# Patient Record
Sex: Female | Born: 1950 | Race: Black or African American | Hispanic: No | Marital: Single | Smoking: Current every day smoker
Health system: Southern US, Community
[De-identification: ages and names within clinical notes are randomized; demographics above are authoritative.]

## PROBLEM LIST (undated history)

## (undated) DIAGNOSIS — K219 Gastro-esophageal reflux disease without esophagitis: Secondary | ICD-10-CM

## (undated) DIAGNOSIS — M8580 Other specified disorders of bone density and structure, unspecified site: Secondary | ICD-10-CM

## (undated) DIAGNOSIS — E785 Hyperlipidemia, unspecified: Secondary | ICD-10-CM

## (undated) DIAGNOSIS — I10 Essential (primary) hypertension: Secondary | ICD-10-CM

## (undated) HISTORY — DX: Gastro-esophageal reflux disease without esophagitis: K21.9

## (undated) HISTORY — PX: APPENDECTOMY: SHX54

## (undated) HISTORY — DX: Hyperlipidemia, unspecified: E78.5

## (undated) HISTORY — DX: Other specified disorders of bone density and structure, unspecified site: M85.80

---

## 1997-11-15 ENCOUNTER — Ambulatory Visit: Admission: RE | Admit: 1997-11-15 | Discharge: 1997-11-15 | Payer: Self-pay | Admitting: Sports Medicine

## 1997-12-02 ENCOUNTER — Encounter: Admission: RE | Admit: 1997-12-02 | Discharge: 1997-12-02 | Payer: Self-pay | Admitting: Sports Medicine

## 1998-01-07 ENCOUNTER — Encounter: Admission: RE | Admit: 1998-01-07 | Discharge: 1998-01-07 | Payer: Self-pay | Admitting: Family Medicine

## 1998-02-02 ENCOUNTER — Encounter: Admission: RE | Admit: 1998-02-02 | Discharge: 1998-02-02 | Payer: Self-pay | Admitting: Family Medicine

## 1998-12-30 ENCOUNTER — Encounter: Admission: RE | Admit: 1998-12-30 | Discharge: 1998-12-30 | Payer: Self-pay | Admitting: Family Medicine

## 1999-07-11 ENCOUNTER — Encounter: Admission: RE | Admit: 1999-07-11 | Discharge: 1999-07-11 | Payer: Self-pay | Admitting: Family Medicine

## 2000-04-18 ENCOUNTER — Emergency Department (HOSPITAL_COMMUNITY): Admission: EM | Admit: 2000-04-18 | Discharge: 2000-04-18 | Payer: Self-pay | Admitting: Emergency Medicine

## 2000-05-01 ENCOUNTER — Emergency Department (HOSPITAL_COMMUNITY): Admission: EM | Admit: 2000-05-01 | Discharge: 2000-05-01 | Payer: Self-pay | Admitting: Emergency Medicine

## 2000-05-02 ENCOUNTER — Emergency Department (HOSPITAL_COMMUNITY): Admission: EM | Admit: 2000-05-02 | Discharge: 2000-05-02 | Payer: Self-pay | Admitting: Emergency Medicine

## 2000-12-23 ENCOUNTER — Encounter (INDEPENDENT_AMBULATORY_CARE_PROVIDER_SITE_OTHER): Payer: Self-pay | Admitting: *Deleted

## 2000-12-23 LAB — CONVERTED CEMR LAB

## 2001-04-04 ENCOUNTER — Encounter: Admission: RE | Admit: 2001-04-04 | Discharge: 2001-04-04 | Payer: Self-pay

## 2001-04-24 ENCOUNTER — Encounter: Admission: RE | Admit: 2001-04-24 | Discharge: 2001-04-24 | Payer: Self-pay

## 2001-05-02 ENCOUNTER — Encounter: Payer: Self-pay | Admitting: Obstetrics

## 2001-05-02 ENCOUNTER — Ambulatory Visit (HOSPITAL_COMMUNITY): Admission: RE | Admit: 2001-05-02 | Discharge: 2001-05-02 | Payer: Self-pay | Admitting: Obstetrics

## 2001-08-19 ENCOUNTER — Encounter: Admission: RE | Admit: 2001-08-19 | Discharge: 2001-08-19 | Payer: Self-pay | Admitting: Family Medicine

## 2001-09-02 ENCOUNTER — Encounter: Admission: RE | Admit: 2001-09-02 | Discharge: 2001-09-02 | Payer: Self-pay | Admitting: *Deleted

## 2001-10-14 ENCOUNTER — Emergency Department (HOSPITAL_COMMUNITY): Admission: EM | Admit: 2001-10-14 | Discharge: 2001-10-14 | Payer: Self-pay | Admitting: Emergency Medicine

## 2002-01-29 ENCOUNTER — Emergency Department (HOSPITAL_COMMUNITY): Admission: EM | Admit: 2002-01-29 | Discharge: 2002-01-29 | Payer: Self-pay

## 2002-07-27 ENCOUNTER — Encounter: Admission: RE | Admit: 2002-07-27 | Discharge: 2002-07-27 | Payer: Self-pay | Admitting: Family Medicine

## 2002-07-30 ENCOUNTER — Encounter: Admission: RE | Admit: 2002-07-30 | Discharge: 2002-07-30 | Payer: Self-pay | Admitting: Family Medicine

## 2002-08-28 ENCOUNTER — Encounter: Admission: RE | Admit: 2002-08-28 | Discharge: 2002-08-28 | Payer: Self-pay | Admitting: Family Medicine

## 2005-09-21 ENCOUNTER — Encounter: Admission: RE | Admit: 2005-09-21 | Discharge: 2005-09-21 | Payer: Self-pay | Admitting: Internal Medicine

## 2005-11-06 ENCOUNTER — Inpatient Hospital Stay (HOSPITAL_COMMUNITY): Admission: EM | Admit: 2005-11-06 | Discharge: 2005-11-07 | Payer: Self-pay | Admitting: Emergency Medicine

## 2005-11-07 ENCOUNTER — Ambulatory Visit: Payer: Self-pay | Admitting: Cardiology

## 2006-08-22 DIAGNOSIS — E7800 Pure hypercholesterolemia, unspecified: Secondary | ICD-10-CM | POA: Insufficient documentation

## 2006-08-22 DIAGNOSIS — H539 Unspecified visual disturbance: Secondary | ICD-10-CM

## 2006-08-22 DIAGNOSIS — F17200 Nicotine dependence, unspecified, uncomplicated: Secondary | ICD-10-CM | POA: Insufficient documentation

## 2006-08-22 DIAGNOSIS — R011 Cardiac murmur, unspecified: Secondary | ICD-10-CM

## 2006-08-22 DIAGNOSIS — K1321 Leukoplakia of oral mucosa, including tongue: Secondary | ICD-10-CM

## 2006-08-22 DIAGNOSIS — E669 Obesity, unspecified: Secondary | ICD-10-CM

## 2006-08-22 DIAGNOSIS — I10 Essential (primary) hypertension: Secondary | ICD-10-CM | POA: Insufficient documentation

## 2006-08-22 DIAGNOSIS — G44209 Tension-type headache, unspecified, not intractable: Secondary | ICD-10-CM

## 2006-08-23 ENCOUNTER — Encounter (INDEPENDENT_AMBULATORY_CARE_PROVIDER_SITE_OTHER): Payer: Self-pay | Admitting: *Deleted

## 2007-02-28 ENCOUNTER — Emergency Department (HOSPITAL_COMMUNITY): Admission: EM | Admit: 2007-02-28 | Discharge: 2007-02-28 | Payer: Self-pay | Admitting: Family Medicine

## 2010-06-18 ENCOUNTER — Emergency Department (HOSPITAL_COMMUNITY)
Admission: EM | Admit: 2010-06-18 | Discharge: 2010-06-18 | Payer: Self-pay | Source: Home / Self Care | Admitting: Emergency Medicine

## 2010-06-18 ENCOUNTER — Encounter (INDEPENDENT_AMBULATORY_CARE_PROVIDER_SITE_OTHER): Payer: Self-pay | Admitting: Emergency Medicine

## 2010-10-08 ENCOUNTER — Emergency Department (HOSPITAL_COMMUNITY)
Admission: EM | Admit: 2010-10-08 | Discharge: 2010-10-08 | Disposition: A | Discharge status: Home / Self Care | Payer: No Typology Code available for payment source | Attending: Emergency Medicine | Admitting: Emergency Medicine

## 2010-10-08 ENCOUNTER — Emergency Department (HOSPITAL_COMMUNITY): Payer: No Typology Code available for payment source

## 2010-10-08 DIAGNOSIS — M545 Low back pain, unspecified: Secondary | ICD-10-CM | POA: Insufficient documentation

## 2010-10-08 DIAGNOSIS — I10 Essential (primary) hypertension: Secondary | ICD-10-CM | POA: Insufficient documentation

## 2010-10-08 DIAGNOSIS — M5137 Other intervertebral disc degeneration, lumbosacral region: Secondary | ICD-10-CM | POA: Insufficient documentation

## 2010-10-08 DIAGNOSIS — M51379 Other intervertebral disc degeneration, lumbosacral region without mention of lumbar back pain or lower extremity pain: Secondary | ICD-10-CM | POA: Insufficient documentation

## 2010-11-10 NOTE — Discharge Summary (Signed)
Rebecca Pope, Rebecca Pope               ACCOUNT NO.:  [id number]   MEDICAL RECORD NO.:  [id number]          PATIENT TYPE:  INP   LOCATION:  3707                         FACILITY:  [facility]   PHYSICIAN:  Fleet Contras, M.D.    DATE OF BIRTH:  1950-10-26   DATE OF ADMISSION:  11/06/2005  DATE OF DISCHARGE:  11/07/2005                                 DISCHARGE SUMMARY   ADMITTING PHYSICIAN:  Fleet Contras.   DISCHARGE PHYSICIAN:  Fleet Contras.   HISTORY OF PRESENT ILLNESS:  Rebecca Pope is a 60 year old African American  lady with past medical history significant for hypertension and obesity.  She went to the emergency room at [facility] with one day history  of headaches, one episode of vomiting.  She had no dizziness, slurring of  speech, weakness of extremities or seizures.  She had no chest pain,  shortness of breath, orthopnea or PND.  She had no palpitations.  In the  emergency room, her blood pressure was 190/130, heart rate of 68.  She was  given intravenous labetalol with inadequate response.  This was, therefore,  changed to IV Nipride.  Blood pressure remained elevated, she was,  therefore, admitted to the hospital for close monitoring.   HOSPITAL COURSE:  On admission, within 24 hours, her blood pressure was down  to 110/70, heart rate was 64 regular.  She had no further headache or  vomiting.   LABORATORY DATA:  Were all negative, including a CT scan of the head.  Her  urine drug screen was negative.  CKs and troponins x3 were all negative.   Patient, therefore, considered stable for discharge home on Nov 07, 2005.   ADMISSION DIAGNOSIS:  Hypertensive urgency.   She also had a 2D echocardiogram, renal artery Doppler performed to rule out  renal artery stenosis, the results of these were pending at the time of  discharge.  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  The  echocardiogram showing normal LV function with mild LVH.   CONDITION ON DISCHARGE:  Her condition on discharge was stable.   DISPOSITION:  Home.   DISCHARGE MEDICATIONS:  Her discharge medications were:  1. Diovan HCT 160/12.5 one daily.  2. Metoprolol 25 b.i.d.  3. Aspirin 325 mg daily.   She is to followup with me in 3 days.  She was to quit tobacco use.      Fleet Contras, M.D.  Electronically Signed     EA/MEDQ  D:  01/28/2006  T:  01/29/2006  Job:  [id number]

## 2011-04-06 LAB — I-STAT 8, (EC8 V) (CONVERTED LAB)
Acid-Base Excess: 1
Bicarbonate: 26.6 — ABNORMAL HIGH
Chloride: 106
HCT: 44
Operator id: 239701
TCO2: 28
pCO2, Ven: 43.7 — ABNORMAL LOW

## 2011-04-06 LAB — POCT I-STAT CREATININE: Operator id: 239701

## 2013-04-07 ENCOUNTER — Encounter (HOSPITAL_COMMUNITY): Payer: Self-pay | Admitting: Emergency Medicine

## 2013-04-07 ENCOUNTER — Emergency Department (INDEPENDENT_AMBULATORY_CARE_PROVIDER_SITE_OTHER)
Admission: EM | Admit: 2013-04-07 | Discharge: 2013-04-07 | Disposition: A | Discharge status: Home / Self Care | Payer: Self-pay | Source: Home / Self Care | Attending: Family Medicine | Admitting: Family Medicine

## 2013-04-07 DIAGNOSIS — E669 Obesity, unspecified: Secondary | ICD-10-CM

## 2013-04-07 DIAGNOSIS — I10 Essential (primary) hypertension: Secondary | ICD-10-CM | POA: Diagnosis present

## 2013-04-07 HISTORY — DX: Essential (primary) hypertension: I10

## 2013-04-07 LAB — POCT I-STAT, CHEM 8
BUN: 16 mg/dL (ref 6–23)
Calcium, Ion: 1.15 mmol/L (ref 1.13–1.30)
Creatinine, Ser: 1.2 mg/dL — ABNORMAL HIGH (ref 0.50–1.10)
Glucose, Bld: 88 mg/dL (ref 70–99)
Sodium: 141 mEq/L (ref 135–145)
TCO2: 29 mmol/L (ref 0–100)

## 2013-04-07 MED ORDER — HYDROCHLOROTHIAZIDE 25 MG PO TABS: 25.0000 mg | ORAL_TABLET | Freq: Every day | ORAL | Status: DC

## 2013-04-07 NOTE — ED Provider Notes (Signed)
Rebecca Pope is a 62 y.o. female who presents to Urgent Care today for hypertension. Patient has a past medical history for hypertension, however she has not been on hydrochlorothiazide for over 6 years. She typically has done well. Yesterday she noted headaches and checked her blood pressure at work and found it to be 191/124. She is feeling well today with no continued headache chest pain palpitations or trouble breathing. She denies any weakness or numbness and feels well otherwise. She does not have a doctor or insurance. She does smoke.   Past Medical History  Diagnosis Date  . Hypertension    History  Substance Use Topics  . Smoking status: Current Every Day Smoker -- 1.00 packs/day    Types: Cigarettes  . Smokeless tobacco: Not on file  . Alcohol Use: No   ROS as above Medications reviewed. No current facility-administered medications for this encounter.   Current Outpatient Prescriptions  Medication Sig Dispense Refill  . hydrochlorothiazide (HYDRODIURIL) 25 MG tablet Take 1 tablet (25 mg total) by mouth daily.  30 tablet  2    Exam:  BP 136/102  Pulse 73  Temp(Src) 99 F (37.2 C) (Oral)  Resp 17  SpO2 100% Gen: Well NAD HEENT: EOMI,  MMM Lungs: CTABL Nl WOB Heart: RRR no MRG Abd: NABS, NT, ND Exts: Non edematous BL  LE, warm and well perfused.  Neuro: Alert and oriented normal balance coordination and gait  I stat chem 8 results:  Sodium: 141 Potassium: 3.7 Chloride: 105 Bicarbonate: 29 BUN: 16 Creatinine 1.2 Glucose: 88 Hemoglobin: 16  Assessment and Plan: 62 y.o. female with uncontrolled hypertension. Plan to restart hydrochlorothiazide at 25 mg. Refer to the [employer]. Discussed warning signs or symptoms. Please see discharge instructions. Patient expresses understanding.      Rodolph Bong, MD 04/07/13 [phone]

## 2013-04-07 NOTE — ED Notes (Signed)
C/o hypertension. BP 191/124 yesterday. Having headaches. Denies chest pain, visual changes, and sob.   States she stopped bp meds many years ago because she does not like to take medication.

## 2013-10-23 ENCOUNTER — Encounter: Payer: Self-pay | Admitting: Internal Medicine

## 2013-10-23 ENCOUNTER — Ambulatory Visit: Payer: BC Managed Care – PPO | Attending: Internal Medicine | Admitting: Internal Medicine

## 2013-10-23 VITALS — BP 168/96 | HR 62 | Temp 98.9°F | Resp 16 | Ht 64.5 in | Wt 209.2 lb

## 2013-10-23 DIAGNOSIS — I10 Essential (primary) hypertension: Secondary | ICD-10-CM

## 2013-10-23 DIAGNOSIS — Z23 Encounter for immunization: Secondary | ICD-10-CM

## 2013-10-23 DIAGNOSIS — F17200 Nicotine dependence, unspecified, uncomplicated: Secondary | ICD-10-CM

## 2013-10-23 DIAGNOSIS — Z79899 Other long term (current) drug therapy: Secondary | ICD-10-CM | POA: Insufficient documentation

## 2013-10-23 DIAGNOSIS — R21 Rash and other nonspecific skin eruption: Secondary | ICD-10-CM | POA: Insufficient documentation

## 2013-10-23 DIAGNOSIS — Z0000 Encounter for general adult medical examination without abnormal findings: Secondary | ICD-10-CM

## 2013-10-23 MED ORDER — HYDROCHLOROTHIAZIDE 25 MG PO TABS: 25.0000 mg | ORAL_TABLET | Freq: Every day | ORAL | Status: DC

## 2013-10-23 NOTE — Patient Instructions (Signed)
DASH Diet The DASH diet stands for "Dietary Approaches to Stop Hypertension." It is a healthy eating plan that has been shown to reduce high blood pressure (hypertension) in as little as 14 days, while also possibly providing other significant health benefits. These other health benefits include reducing the risk of breast cancer after menopause and reducing the risk of type 2 diabetes, heart disease, colon cancer, and stroke. Health benefits also include weight loss and slowing kidney failure in patients with chronic kidney disease.  DIET GUIDELINES  Limit salt (sodium). Your diet should contain less than 1500 mg of sodium daily.  Limit refined or processed carbohydrates. Your diet should include mostly whole grains. Desserts and added sugars should be used sparingly.  Include small amounts of heart-healthy fats. These types of fats include nuts, oils, and tub margarine. Limit saturated and trans fats. These fats have been shown to be harmful in the body. CHOOSING FOODS  The following food groups are based on a 2000 calorie diet. See your Registered Dietitian for individual calorie needs. Grains and Grain Products (6 to 8 servings daily)  Eat More Often: Whole-wheat bread, brown rice, whole-grain or wheat pasta, quinoa, popcorn without added fat or salt (air popped).  Eat Less Often: White bread, white pasta, white rice, cornbread. Vegetables (4 to 5 servings daily)  Eat More Often: Fresh, frozen, and canned vegetables. Vegetables may be raw, steamed, roasted, or grilled with a minimal amount of fat.  Eat Less Often/Avoid: Creamed or fried vegetables. Vegetables in a cheese sauce. Fruit (4 to 5 servings daily)  Eat More Often: All fresh, canned (in natural juice), or frozen fruits. Dried fruits without added sugar. One hundred percent fruit juice ( cup [237 mL] daily).  Eat Less Often: Dried fruits with added sugar. Canned fruit in light or heavy syrup. Lean Meats, Fish, and Poultry (2  servings or less daily. One serving is 3 to 4 oz [85-114 g]).  Eat More Often: Ninety percent or leaner ground beef, tenderloin, sirloin. Round cuts of beef, chicken breast, turkey breast. All fish. Grill, bake, or broil your meat. Nothing should be fried.  Eat Less Often/Avoid: Fatty cuts of meat, turkey, or chicken leg, thigh, or wing. Fried cuts of meat or fish. Dairy (2 to 3 servings)  Eat More Often: Low-fat or fat-free milk, low-fat plain or light yogurt, reduced-fat or part-skim cheese.  Eat Less Often/Avoid: Milk (whole, 2%).Whole milk yogurt. Full-fat cheeses. Nuts, Seeds, and Legumes (4 to 5 servings per week)  Eat More Often: All without added salt.  Eat Less Often/Avoid: Salted nuts and seeds, canned beans with added salt. Fats and Sweets (limited)  Eat More Often: Vegetable oils, tub margarines without trans fats, sugar-free gelatin. Mayonnaise and salad dressings.  Eat Less Often/Avoid: Coconut oils, palm oils, butter, stick margarine, cream, half and half, cookies, candy, pie. FOR MORE INFORMATION The Dash Diet Eating Plan: www.dashdiet.org Document Released: 05/31/2011 Document Revised: 09/03/2011 Document Reviewed: 05/31/2011 ExitCare Patient Information 2014 [employer]. Smoking Cessation Quitting smoking is important to your health and has many advantages. However, it is not always easy to quit since nicotine is a very addictive drug. Often times, people try 3 times or more before being able to quit. This document explains the best ways for you to prepare to quit smoking. Quitting takes hard work and a lot of effort, but you can do it. ADVANTAGES OF QUITTING SMOKING  You will live longer, feel better, and live better.  Your body will feel the   impact of quitting smoking almost immediately.  Within 20 minutes, blood pressure decreases. Your pulse returns to its normal level.  After 8 hours, carbon monoxide levels in the blood return to normal. Your oxygen level  increases.  After 24 hours, the chance of having a heart attack starts to decrease. Your breath, hair, and body stop smelling like smoke.  After 48 hours, damaged nerve endings begin to recover. Your sense of taste and smell improve.  After 72 hours, the body is virtually free of nicotine. Your bronchial tubes relax and breathing becomes easier.  After 2 to 12 weeks, lungs can hold more air. Exercise becomes easier and circulation improves.  The risk of having a heart attack, stroke, cancer, or lung disease is greatly reduced.  After 1 year, the risk of coronary heart disease is cut in half.  After 5 years, the risk of stroke falls to the same as a nonsmoker.  After 10 years, the risk of lung cancer is cut in half and the risk of other cancers decreases significantly.  After 15 years, the risk of coronary heart disease drops, usually to the level of a nonsmoker.  If you are pregnant, quitting smoking will improve your chances of having a healthy baby.  The people you live with, especially any children, will be healthier.  You will have extra money to spend on things other than cigarettes. QUESTIONS TO THINK ABOUT BEFORE ATTEMPTING TO QUIT You may want to talk about your answers with your caregiver.  Why do you want to quit?  If you tried to quit in the past, what helped and what did not?  What will be the most difficult situations for you after you quit? How will you plan to handle them?  Who can help you through the tough times? Your family? Friends? A caregiver?  What pleasures do you get from smoking? What ways can you still get pleasure if you quit? Here are some questions to ask your caregiver:  How can you help me to be successful at quitting?  What medicine do you think would be best for me and how should I take it?  What should I do if I need more help?  What is smoking withdrawal like? How can I get information on withdrawal? GET READY  Set a quit  date.  Change your environment by getting rid of all cigarettes, ashtrays, matches, and lighters in your home, car, or work. Do not let people smoke in your home.  Review your past attempts to quit. Think about what worked and what did not. GET SUPPORT AND ENCOURAGEMENT You have a better chance of being successful if you have help. You can get support in many ways.  Tell your family, friends, and co-workers that you are going to quit and need their support. Ask them not to smoke around you.  Get individual, group, or telephone counseling and support. Programs are available at local hospitals and health centers. Call your local health department for information about programs in your area.  Spiritual beliefs and practices may help some smokers quit.  Download a "quit meter" on your computer to keep track of quit statistics, such as how long you have gone without smoking, cigarettes not smoked, and money saved.  Get a self-help book about quitting smoking and staying off of tobacco. LEARN NEW SKILLS AND BEHAVIORS  Distract yourself from urges to smoke. Talk to someone, go for a walk, or occupy your time with a task.  Change your   normal routine. Take a different route to work. Drink tea instead of coffee. Eat breakfast in a different place.  Reduce your stress. Take a hot bath, exercise, or read a book.  Plan something enjoyable to do every day. Reward yourself for not smoking.  Explore interactive web-based programs that specialize in helping you quit. GET MEDICINE AND USE IT CORRECTLY Medicines can help you stop smoking and decrease the urge to smoke. Combining medicine with the above behavioral methods and support can greatly increase your chances of successfully quitting smoking.  Nicotine replacement therapy helps deliver nicotine to your body without the negative effects and risks of smoking. Nicotine replacement therapy includes nicotine gum, lozenges, inhalers, nasal sprays, and  skin patches. Some may be available over-the-counter and others require a prescription.  Antidepressant medicine helps people abstain from smoking, but how this works is unknown. This medicine is available by prescription.  Nicotinic receptor partial agonist medicine simulates the effect of nicotine in your brain. This medicine is available by prescription. Ask your caregiver for advice about which medicines to use and how to use them based on your health history. Your caregiver will tell you what side effects to look out for if you choose to be on a medicine or therapy. Carefully read the information on the package. Do not use any other product containing nicotine while using a nicotine replacement product.  RELAPSE OR DIFFICULT SITUATIONS Most relapses occur within the first 3 months after quitting. Do not be discouraged if you start smoking again. Remember, most people try several times before finally quitting. You may have symptoms of withdrawal because your body is used to nicotine. You may crave cigarettes, be irritable, feel very hungry, cough often, get headaches, or have difficulty concentrating. The withdrawal symptoms are only temporary. They are strongest when you first quit, but they will go away within 10 14 days. To reduce the chances of relapse, try to:  Avoid drinking alcohol. Drinking lowers your chances of successfully quitting.  Reduce the amount of caffeine you consume. Once you quit smoking, the amount of caffeine in your body increases and can give you symptoms, such as a rapid heartbeat, sweating, and anxiety.  Avoid smokers because they can make you want to smoke.  Do not let weight gain distract you. Many smokers will gain weight when they quit, usually less than 10 pounds. Eat a healthy diet and stay active. You can always lose the weight gained after you quit.  Find ways to improve your mood other than smoking. FOR MORE INFORMATION  www.smokefree.gov  Document  Released: 06/05/2001 Document Revised: 12/11/2011 Document Reviewed: 09/20/2011 ExitCare Patient Information 2014 [employer].  

## 2013-10-23 NOTE — Progress Notes (Signed)
Pt here to establish care. Has history of HTN and has been out of med for 1 month because has not had PCP to prescribe it. C/O Headache today

## 2013-10-23 NOTE — Progress Notes (Signed)
Patient ID: Rebecca Pope, female   DOB: March 17, 1951, 63 y.o.   MRN: [id number]  [id number]  [id number]  DOB - March 17, 1951  CC:  Chief Complaint  Patient presents with  . Establish Care  . Hypertension       HPI: Rebecca Pope is a 63 y.o. female with a past medical history of hypertension, here today to establish medical care.  Patient reports she has not had a primary care physician in over 5 years. She reports that she has been out of her hydrochlorothiazide for over one year patient reports that she's been having headaches for the past few days and decided it was time to start back on her medication. Patient also reports with a pruritic rash to bilateral lower extremities that started 2 days ago after consuming tomatoes. She has tried over-the-counter hydrocortisone cream with some relief.  Patient has No chest pain, No abdominal pain - No Nausea, No new weakness tingling or numbness, No Cough - SOB.  No Known Allergies Past Medical History  Diagnosis Date  . Hypertension    No current outpatient prescriptions on file prior to visit.   No current facility-administered medications on file prior to visit.   Family History  Problem Relation Age of Onset  . Cancer Mother   . Hypertension Mother   . Cancer Father    History   Social History  . Marital Status: Single    Spouse Name: N/A    Number of Children: N/A  . Years of Education: N/A   Occupational History  . Not on file.   Social History Main Topics  . Smoking status: Current Every Day Smoker -- 1.00 packs/day    Types: Cigarettes  . Smokeless tobacco: Not on file  . Alcohol Use: No  . Drug Use: No  . Sexual Activity: Not Currently   Other Topics Concern  . Not on file   Social History Narrative  . No narrative on file    Review of Systems: Constitutional: Negative for fever, chills, diaphoresis, activity change, appetite change and fatigue. HENT: Negative for ear pain, nosebleeds, congestion,  facial swelling, rhinorrhea, neck pain, neck stiffness and ear discharge.  Eyes: Negative for pain, discharge, redness, itching and visual disturbance. Respiratory: Negative for cough, choking, chest tightness, shortness of breath, wheezing and stridor.  Cardiovascular: Negative for chest pain, palpitations and leg swelling. Gastrointestinal: Negative for abdominal distention. Genitourinary: Negative for dysuria, urgency, frequency, hematuria, flank pain, decreased urine volume, difficulty urinating and dyspareunia.  Musculoskeletal: Negative for back pain, joint swelling, arthralgia and gait problem. Neurological: Negative for dizziness, tremors, seizures, syncope, facial asymmetry, speech difficulty, weakness, light-headedness, numbness. Positive for headaches  Hematological: Negative for adenopathy. Does not bruise/bleed easily. Psychiatric/Behavioral: Negative for hallucinations, behavioral problems, confusion, dysphoric mood, decreased concentration and agitation.    Objective:   Filed Vitals:   10/23/13 1226  BP: 168/96  Pulse: 62  Temp: 98.9 F (37.2 C)  Resp: 16    Physical Exam: Constitutional: Patient appears well-developed and well-nourished. No distress. HENT: Normocephalic, atraumatic, External right and left ear normal. Oropharynx is clear and moist.  Eyes: Conjunctivae and EOM are normal. PERRLA, no scleral icterus. Neck: Normal ROM. Neck supple. No JVD. No tracheal deviation. No thyromegaly. CVS: RRR, S1/S2 +, no murmurs, no gallops, no carotid bruit.  Pulmonary: Effort and breath sounds normal, no stridor, rhonchi, wheezes, rales.  Abdominal: Soft. BS +, no distension, tenderness, rebound or guarding.  Musculoskeletal: Normal range of motion. No edema and no tenderness.  Lymphadenopathy: No lymphadenopathy noted, cervical Neuro: Alert. Normal reflexes, muscle tone coordination. No cranial nerve deficit. Skin: Skin is warm and dry. No rash noted. Not diaphoretic. No  erythema. No pallor. Psychiatric: Normal mood and affect. Behavior, judgment, thought content normal.  Lab Results  Component Value Date   HGB 16.0* 04/07/2013   HCT 47.0* 04/07/2013   Lab Results  Component Value Date   CREATININE 1.20* 04/07/2013   BUN 16 04/07/2013   NA 141 04/07/2013   K 3.7 04/07/2013   CL 105 04/07/2013    No results found for this basename: HGBA1C   Lipid Panel  No results found for this basename: chol, trig, hdl, cholhdl, vldl, ldlcalc       Assessment and plan:   Rebecca Pope was seen today for establish care and hypertension.  Diagnoses and associated orders for this visit:  HTN (hypertension) Patient may continue with previous regimen  hydrochlorothiazide (HYDRODIURIL) 25 MG tablet; Take 1 tablet (25 mg total) by mouth daily.  - CBC with Differential; Future - COMPLETE METABOLIC PANEL WITH GFR; Future - TSH; Future - Lipid panel; Future - Hemoglobin A1c; Future - Ambulatory referral to Ophthalmology  Smoking Patient counseled about smoking cessation. Patient reports she's not ready to quit, will reevaluate on subsequent visits.  Preventative health care - Tdap vaccine greater than or equal to 7yo IM - MM DIGITAL SCREENING BILATERAL; Future - CT Virtual Colonoscopy Screening; Future   Return in about 1 week (around 10/30/2013) for Lab Visit, Nurse Visit-BP check. Will follow up with provider in 3 months.    Hol[place] CommonsValerie Keck, NP-C [facility] [phone] 10/23/2013, 2:11 PM

## 2013-10-30 ENCOUNTER — Ambulatory Visit: Payer: BC Managed Care – PPO | Attending: Internal Medicine

## 2013-11-06 ENCOUNTER — Ambulatory Visit: Payer: BC Managed Care – PPO | Attending: Internal Medicine

## 2013-11-06 DIAGNOSIS — I10 Essential (primary) hypertension: Secondary | ICD-10-CM

## 2013-11-06 LAB — CBC WITH DIFFERENTIAL/PLATELET
BASOS ABS: 0 10*3/uL (ref 0.0–0.1)
Basophils Relative: 0 % (ref 0–1)
Eosinophils Absolute: 0.1 10*3/uL (ref 0.0–0.7)
Eosinophils Relative: 2 % (ref 0–5)
HEMATOCRIT: 41.3 % (ref 36.0–46.0)
HEMOGLOBIN: 13.4 g/dL (ref 12.0–15.0)
LYMPHS PCT: 40 % (ref 12–46)
Lymphs Abs: 2.1 10*3/uL (ref 0.7–4.0)
MCH: 24.4 pg — ABNORMAL LOW (ref 26.0–34.0)
MCHC: 32.4 g/dL (ref 30.0–36.0)
MCV: 75.2 fL — ABNORMAL LOW (ref 78.0–100.0)
MONO ABS: 0.5 10*3/uL (ref 0.1–1.0)
Monocytes Relative: 10 % (ref 3–12)
NEUTROS PCT: 48 % (ref 43–77)
Neutro Abs: 2.5 10*3/uL (ref 1.7–7.7)
Platelets: 209 10*3/uL (ref 150–400)
RBC: 5.49 MIL/uL — ABNORMAL HIGH (ref 3.87–5.11)
RDW: 16.4 % — ABNORMAL HIGH (ref 11.5–15.5)
WBC: 5.3 10*3/uL (ref 4.0–10.5)

## 2013-11-07 LAB — COMPLETE METABOLIC PANEL WITH GFR
ALBUMIN: 3.8 g/dL (ref 3.5–5.2)
ALK PHOS: 66 U/L (ref 39–117)
ALT: 9 U/L (ref 0–35)
AST: 14 U/L (ref 0–37)
BUN: 21 mg/dL (ref 6–23)
CHLORIDE: 98 meq/L (ref 96–112)
CO2: 28 mEq/L (ref 19–32)
Calcium: 9.4 mg/dL (ref 8.4–10.5)
Creat: 0.97 mg/dL (ref 0.50–1.10)
GFR, Est African American: 71 mL/min
GFR, Est Non African American: 62 mL/min
Glucose, Bld: 78 mg/dL (ref 70–99)
POTASSIUM: 3.7 meq/L (ref 3.5–5.3)
SODIUM: 138 meq/L (ref 135–145)
Total Bilirubin: 0.3 mg/dL (ref 0.2–1.2)
Total Protein: 7.4 g/dL (ref 6.0–8.3)

## 2013-11-07 LAB — LIPID PANEL
CHOL/HDL RATIO: 4.7 ratio
Cholesterol: 242 mg/dL — ABNORMAL HIGH (ref 0–200)
HDL: 51 mg/dL (ref >39)
LDL CALC: 171 mg/dL — AB (ref 0–99)
Triglycerides: 101 mg/dL (ref <150)
VLDL: 20 mg/dL (ref 0–40)

## 2013-11-07 LAB — HEMOGLOBIN A1C
HEMOGLOBIN A1C: 6.2 % — AB (ref <5.7)
Mean Plasma Glucose: 131 mg/dL — ABNORMAL HIGH (ref <117)

## 2013-11-07 LAB — TSH: TSH: 0.573 u[IU]/mL (ref 0.350–4.500)

## 2013-11-09 ENCOUNTER — Other Ambulatory Visit: Payer: Self-pay | Admitting: Internal Medicine

## 2013-11-09 DIAGNOSIS — E785 Hyperlipidemia, unspecified: Secondary | ICD-10-CM

## 2013-11-09 MED ORDER — LOVASTATIN 20 MG PO TABS: 20.0000 mg | ORAL_TABLET | Freq: Every day | ORAL | Status: DC

## 2013-11-10 ENCOUNTER — Other Ambulatory Visit: Payer: Self-pay | Admitting: Internal Medicine

## 2013-11-10 DIAGNOSIS — Z1231 Encounter for screening mammogram for malignant neoplasm of breast: Secondary | ICD-10-CM

## 2013-11-27 ENCOUNTER — Encounter (INDEPENDENT_AMBULATORY_CARE_PROVIDER_SITE_OTHER): Payer: Self-pay

## 2013-11-27 ENCOUNTER — Ambulatory Visit
Admission: RE | Admit: 2013-11-27 | Discharge: 2013-11-27 | Disposition: A | Discharge status: Home / Self Care | Payer: BC Managed Care – PPO | Source: Ambulatory Visit | Attending: Internal Medicine | Admitting: Internal Medicine

## 2013-11-27 DIAGNOSIS — Z1231 Encounter for screening mammogram for malignant neoplasm of breast: Secondary | ICD-10-CM

## 2013-12-01 ENCOUNTER — Telehealth: Payer: Self-pay | Admitting: *Deleted

## 2013-12-01 NOTE — Telephone Encounter (Signed)
Message copied by Fredderick Severance on Tue Dec 01, 2013 12:17 PM ------      Message from: Ambrose Finland      Created: Sun Nov 29, 2013  6:44 PM       Let pt know her mammogram is normal. Repeat test next year. Thanks ------

## 2013-12-01 NOTE — Telephone Encounter (Signed)
Left message for pt to return call to review results of mammogram.

## 2014-04-02 ENCOUNTER — Encounter: Payer: Self-pay | Admitting: Internal Medicine

## 2014-04-02 ENCOUNTER — Ambulatory Visit: Payer: BC Managed Care – PPO | Attending: Internal Medicine | Admitting: Internal Medicine

## 2014-04-02 VITALS — BP 130/85 | HR 81 | Temp 98.8°F | Resp 16 | Ht 64.0 in | Wt 206.0 lb

## 2014-04-02 DIAGNOSIS — E785 Hyperlipidemia, unspecified: Secondary | ICD-10-CM

## 2014-04-02 DIAGNOSIS — F17200 Nicotine dependence, unspecified, uncomplicated: Secondary | ICD-10-CM

## 2014-04-02 DIAGNOSIS — Z2820 Immunization not carried out because of patient decision for unspecified reason: Secondary | ICD-10-CM | POA: Insufficient documentation

## 2014-04-02 DIAGNOSIS — Z720 Tobacco use: Secondary | ICD-10-CM

## 2014-04-02 DIAGNOSIS — F17210 Nicotine dependence, cigarettes, uncomplicated: Secondary | ICD-10-CM | POA: Diagnosis not present

## 2014-04-02 DIAGNOSIS — I10 Essential (primary) hypertension: Secondary | ICD-10-CM | POA: Diagnosis not present

## 2014-04-02 DIAGNOSIS — Z79899 Other long term (current) drug therapy: Secondary | ICD-10-CM | POA: Insufficient documentation

## 2014-04-02 DIAGNOSIS — Z2821 Immunization not carried out because of patient refusal: Secondary | ICD-10-CM

## 2014-04-02 DIAGNOSIS — Z8249 Family history of ischemic heart disease and other diseases of the circulatory system: Secondary | ICD-10-CM | POA: Diagnosis not present

## 2014-04-02 DIAGNOSIS — Z7982 Long term (current) use of aspirin: Secondary | ICD-10-CM | POA: Insufficient documentation

## 2014-04-02 DIAGNOSIS — Z809 Family history of malignant neoplasm, unspecified: Secondary | ICD-10-CM | POA: Diagnosis not present

## 2014-04-02 DIAGNOSIS — R4702 Dysphasia: Secondary | ICD-10-CM | POA: Diagnosis not present

## 2014-04-02 MED ORDER — LOVASTATIN 20 MG PO TABS: 20.0000 mg | ORAL_TABLET | Freq: Every day | ORAL | Status: DC

## 2014-04-02 MED ORDER — HYDROCHLOROTHIAZIDE 25 MG PO TABS: 25.0000 mg | ORAL_TABLET | Freq: Every day | ORAL | Status: DC

## 2014-04-02 NOTE — Progress Notes (Signed)
Patient ID: Rebecca Pope, female   DOB: 23-Sep-1950, 63 y.o.   MRN: [id number]  CC: hypertension   HPI:  Patient presents today for a follow up of hypertension.  She states that she takes her blood pressure medication daily but she still has not mastered eating right. She states that she does eat a lot of pork and fried foods.  She takes her cholesterol medication daily.    No Known Allergies Past Medical History  Diagnosis Date  . Hypertension    Current Outpatient Prescriptions on File Prior to Visit  Medication Sig Dispense Refill  . aspirin 325 MG EC tablet Take 325 mg by mouth every 6 (six) hours as needed for pain (headache).      . hydrochlorothiazide (HYDRODIURIL) 25 MG tablet Take 1 tablet (25 mg total) by mouth daily.  30 tablet  2  . lovastatin (MEVACOR) 20 MG tablet Take 1 tablet (20 mg total) by mouth at bedtime.  30 tablet  2   No current facility-administered medications on file prior to visit.   Family History  Problem Relation Age of Onset  . Cancer Mother   . Hypertension Mother   . Cancer Father    History   Social History  . Marital Status: Single    Spouse Name: N/A    Number of Children: N/A  . Years of Education: N/A   Occupational History  . Not on file.   Social History Main Topics  . Smoking status: Current Every Day Smoker -- 1.00 packs/day    Types: Cigarettes  . Smokeless tobacco: Not on file  . Alcohol Use: No  . Drug Use: No  . Sexual Activity: Not Currently   Other Topics Concern  . Not on file   Social History Narrative  . No narrative on file   Review of Systems  Eyes: Negative.   Respiratory: Negative.   Cardiovascular: Negative.   Gastrointestinal:       Dysphagia   Neurological: Positive for headaches (ocasional). Negative for dizziness and tingling.  Psychiatric/Behavioral: Negative.       Objective:   Filed Vitals:   04/02/14 1428  BP: 130/85  Pulse: 81  Temp: 98.8 F (37.1 C)  Resp: 16    Physical Exam   Constitutional: She is oriented to person, place, and time.  Cardiovascular: Normal rate, regular rhythm and normal heart sounds.   Pulmonary/Chest: Effort normal and breath sounds normal.  Abdominal: Soft. Bowel sounds are normal.  Musculoskeletal: She exhibits no edema.  Neurological: She is alert and oriented to person, place, and time.  Skin: Skin is warm and dry.     Lab Results  Component Value Date   WBC 5.3 11/06/2013   HGB 13.4 11/06/2013   HCT 41.3 11/06/2013   MCV 75.2* 11/06/2013   PLT 209 11/06/2013   Lab Results  Component Value Date   CREATININE 0.97 11/06/2013   BUN 21 11/06/2013   NA 138 11/06/2013   K 3.7 11/06/2013   CL 98 11/06/2013   CO2 28 11/06/2013    Lab Results  Component Value Date   HGBA1C 6.2* 11/06/2013   Lipid Panel     Component Value Date/Time   CHOL 242* 11/06/2013 0918   TRIG 101 11/06/2013 0918   HDL 51 11/06/2013 0918   CHOLHDL 4.7 11/06/2013 0918   VLDL 20 11/06/2013 0918   LDLCALC 171* 11/06/2013 0918       Assessment and plan:   Rebecca Pope was seen today for  follow-up.  Diagnoses and associated orders for this visit:  Essential hypertension - Continue hydrochlorothiazide (HYDRODIURIL) 25 MG tablet; Take 1 tablet (25 mg total) by mouth daily.  HLD (hyperlipidemia) - Continue lovastatin (MEVACOR) 20 MG tablet; Take 1 tablet (20 mg total) by mouth at bedtime.  TOBACCO DEPENDENCE Discussed in detail.  Developed quit plan with patient. Will continue to encourage and assess patient on each visit  Dysphagia  Patient reports some dysphagia.  Feels like food and soda gets caught in her throat often but she never gets choked.  Feels like food is having a hard time going down.  She states that she does not have monty to pay for a GI visit but will call back if she has more problems or notices it gets worse.   Refused influenza vaccine   Return in about 3 months (around 07/03/2014) for Hypertension.          [place] CommonsKECK, Lionel Woodberry, NP-C [facility] [phone] 04/04/2014, 11:06 PM

## 2014-04-02 NOTE — Patient Instructions (Signed)
Smoking Cessation Quitting smoking is important to your health and has many advantages. However, it is not always easy to quit since nicotine is a very addictive drug. Oftentimes, people try 3 times or more before being able to quit. This document explains the best ways for you to prepare to quit smoking. Quitting takes hard work and a lot of effort, but you can do it. ADVANTAGES OF QUITTING SMOKING  You will live longer, feel better, and live better.  Your body will feel the impact of quitting smoking almost immediately.  Within 20 minutes, blood pressure decreases. Your pulse returns to its normal level.  After 8 hours, carbon monoxide levels in the blood return to normal. Your oxygen level increases.  After 24 hours, the chance of having a heart attack starts to decrease. Your breath, hair, and body stop smelling like smoke.  After 48 hours, damaged nerve endings begin to recover. Your sense of taste and smell improve.  After 72 hours, the body is virtually free of nicotine. Your bronchial tubes relax and breathing becomes easier.  After 2 to 12 weeks, lungs can hold more air. Exercise becomes easier and circulation improves.  The risk of having a heart attack, stroke, cancer, or lung disease is greatly reduced.  After 1 year, the risk of coronary heart disease is cut in half.  After 5 years, the risk of stroke falls to the same as a nonsmoker.  After 10 years, the risk of lung cancer is cut in half and the risk of other cancers decreases significantly.  After 15 years, the risk of coronary heart disease drops, usually to the level of a nonsmoker.  If you are pregnant, quitting smoking will improve your chances of having a healthy baby.  The people you live with, especially any children, will be healthier.  You will have extra money to spend on things other than cigarettes. QUESTIONS TO THINK ABOUT BEFORE ATTEMPTING TO QUIT You may want to talk about your answers with your  health care provider.  Why do you want to quit?  If you tried to quit in the past, what helped and what did not?  What will be the most difficult situations for you after you quit? How will you plan to handle them?  Who can help you through the tough times? Your family? Friends? A health care provider?  What pleasures do you get from smoking? What ways can you still get pleasure if you quit? Here are some questions to ask your health care provider:  How can you help me to be successful at quitting?  What medicine do you think would be best for me and how should I take it?  What should I do if I need more help?  What is smoking withdrawal like? How can I get information on withdrawal? GET READY  Set a quit date.  Change your environment by getting rid of all cigarettes, ashtrays, matches, and lighters in your home, car, or work. Do not let people smoke in your home.  Review your past attempts to quit. Think about what worked and what did not. GET SUPPORT AND ENCOURAGEMENT You have a better chance of being successful if you have help. You can get support in many ways.  Tell your family, friends, and coworkers that you are going to quit and need their support. Ask them not to smoke around you.  Get individual, group, or telephone counseling and support. Programs are available at local hospitals and health centers. Call   your local health department for information about programs in your area.  Spiritual beliefs and practices may help some smokers quit.  Download a "quit meter" on your computer to keep track of quit statistics, such as how long you have gone without smoking, cigarettes not smoked, and money saved.  Get a self-help book about quitting smoking and staying off tobacco. LEARN NEW SKILLS AND BEHAVIORS  Distract yourself from urges to smoke. Talk to someone, go for a walk, or occupy your time with a task.  Change your normal routine. Take a different route to work.  Drink tea instead of coffee. Eat breakfast in a different place.  Reduce your stress. Take a hot bath, exercise, or read a book.  Plan something enjoyable to do every day. Reward yourself for not smoking.  Explore interactive web-based programs that specialize in helping you quit. GET MEDICINE AND USE IT CORRECTLY Medicines can help you stop smoking and decrease the urge to smoke. Combining medicine with the above behavioral methods and support can greatly increase your chances of successfully quitting smoking.  Nicotine replacement therapy helps deliver nicotine to your body without the negative effects and risks of smoking. Nicotine replacement therapy includes nicotine gum, lozenges, inhalers, nasal sprays, and skin patches. Some may be available over-the-counter and others require a prescription.  Antidepressant medicine helps people abstain from smoking, but how this works is unknown. This medicine is available by prescription.  Nicotinic receptor partial agonist medicine simulates the effect of nicotine in your brain. This medicine is available by prescription. Ask your health care provider for advice about which medicines to use and how to use them based on your health history. Your health care provider will tell you what side effects to look out for if you choose to be on a medicine or therapy. Carefully read the information on the package. Do not use any other product containing nicotine while using a nicotine replacement product.  RELAPSE OR DIFFICULT SITUATIONS Most relapses occur within the first 3 months after quitting. Do not be discouraged if you start smoking again. Remember, most people try several times before finally quitting. You may have symptoms of withdrawal because your body is used to nicotine. You may crave cigarettes, be irritable, feel very hungry, cough often, get headaches, or have difficulty concentrating. The withdrawal symptoms are only temporary. They are strongest  when you first quit, but they will go away within 10-14 days. To reduce the chances of relapse, try to:  Avoid drinking alcohol. Drinking lowers your chances of successfully quitting.  Reduce the amount of caffeine you consume. Once you quit smoking, the amount of caffeine in your body increases and can give you symptoms, such as a rapid heartbeat, sweating, and anxiety.  Avoid smokers because they can make you want to smoke.  Do not let weight gain distract you. Many smokers will gain weight when they quit, usually less than 10 pounds. Eat a healthy diet and stay active. You can always lose the weight gained after you quit.  Find ways to improve your mood other than smoking. FOR MORE INFORMATION  www.smokefree.gov  Document Released: 06/05/2001 Document Revised: 10/26/2013 Document Reviewed: 09/20/2011 [employer] Patient Information 2015 [employer]. This information is not intended to replace advice given to you by your health care provider. Make sure you discuss any questions you have with your health care provider.  

## 2014-04-02 NOTE — Progress Notes (Signed)
Pt is here following up on her HTN. Pt states that she has a headache.

## 2015-04-29 ENCOUNTER — Encounter: Payer: Self-pay | Admitting: Internal Medicine

## 2015-04-29 ENCOUNTER — Ambulatory Visit: Payer: Medicare Other | Attending: Internal Medicine | Admitting: Internal Medicine

## 2015-04-29 VITALS — BP 148/88 | HR 67 | Temp 98.0°F | Resp 16 | Ht 64.0 in | Wt 191.6 lb

## 2015-04-29 DIAGNOSIS — Z7982 Long term (current) use of aspirin: Secondary | ICD-10-CM | POA: Diagnosis not present

## 2015-04-29 DIAGNOSIS — Z1239 Encounter for other screening for malignant neoplasm of breast: Secondary | ICD-10-CM

## 2015-04-29 DIAGNOSIS — I10 Essential (primary) hypertension: Secondary | ICD-10-CM | POA: Insufficient documentation

## 2015-04-29 DIAGNOSIS — E785 Hyperlipidemia, unspecified: Secondary | ICD-10-CM | POA: Diagnosis not present

## 2015-04-29 DIAGNOSIS — F17200 Nicotine dependence, unspecified, uncomplicated: Secondary | ICD-10-CM | POA: Insufficient documentation

## 2015-04-29 DIAGNOSIS — Z1211 Encounter for screening for malignant neoplasm of colon: Secondary | ICD-10-CM

## 2015-04-29 DIAGNOSIS — R030 Elevated blood-pressure reading, without diagnosis of hypertension: Secondary | ICD-10-CM

## 2015-04-29 DIAGNOSIS — IMO0001 Reserved for inherently not codable concepts without codable children: Secondary | ICD-10-CM

## 2015-04-29 DIAGNOSIS — Z79899 Other long term (current) drug therapy: Secondary | ICD-10-CM | POA: Insufficient documentation

## 2015-04-29 DIAGNOSIS — Z23 Encounter for immunization: Secondary | ICD-10-CM

## 2015-04-29 DIAGNOSIS — Z1231 Encounter for screening mammogram for malignant neoplasm of breast: Secondary | ICD-10-CM

## 2015-04-29 DIAGNOSIS — Z0000 Encounter for general adult medical examination without abnormal findings: Secondary | ICD-10-CM

## 2015-04-29 MED ORDER — LOVASTATIN 20 MG PO TABS: 20.0000 mg | ORAL_TABLET | Freq: Every day | ORAL | Status: DC

## 2015-04-29 MED ORDER — CLONIDINE HCL 0.1 MG PO TABS
0.1000 mg | ORAL_TABLET | Freq: Once | ORAL | Status: AC
  Administered 2015-04-29: 0.1 mg via ORAL

## 2015-04-29 MED ORDER — HYDROCHLOROTHIAZIDE 25 MG PO TABS: 25.0000 mg | ORAL_TABLET | Freq: Every day | ORAL | Status: DC

## 2015-04-29 NOTE — Progress Notes (Signed)
Patient ID: Rebecca Pope, female   DOB: 05-01-1951, 64 y.o.   MRN: [id number]  CC: HTN follow up  HPI: Rebecca Pope is a 64 y.o. female here today for a follow up visit.  Patient has past medical history of hypertension, tobacco use, and hyperlipidemia. Patient has not been evaluated her in over one year. She states that she has been out of her medication for several months. She denies symptoms of headache, chest pain, SOB, blurred vision, edema, or palpitations today. She usually smokes around 5 cigarettes per day. She is not ready to quit today. She refused influenza vaccination today. Patient reports that she is not up to date on her vision, dental, colonoscopy, or mammogram screenings.   No Known Allergies Past Medical History  Diagnosis Date  . Hypertension    Current Outpatient Prescriptions on File Prior to Visit  Medication Sig Dispense Refill  . aspirin 325 MG EC tablet Take 325 mg by mouth every 6 (six) hours as needed for pain (headache).    . hydrochlorothiazide (HYDRODIURIL) 25 MG tablet Take 1 tablet (25 mg total) by mouth daily. 30 tablet 4  . lovastatin (MEVACOR) 20 MG tablet Take 1 tablet (20 mg total) by mouth at bedtime. 30 tablet 4   No current facility-administered medications on file prior to visit.   Family History  Problem Relation Age of Onset  . Cancer Mother   . Hypertension Mother   . Cancer Father    Social History   Social History  . Marital Status: Single    Spouse Name: N/A  . Number of Children: N/A  . Years of Education: N/A   Occupational History  . Not on file.   Social History Main Topics  . Smoking status: Current Every Day Smoker -- 1.00 packs/day    Types: Cigarettes  . Smokeless tobacco: Not on file  . Alcohol Use: No  . Drug Use: No  . Sexual Activity: Not Currently   Other Topics Concern  . Not on file   Social History Narrative    Review of Systems: Constitutional: Negative for fever, chills, diaphoresis, activity  change, appetite change and fatigue. HENT: Negative for ear pain, nosebleeds, congestion, facial swelling, rhinorrhea, neck pain, neck stiffness and ear discharge.  Eyes: Negative for pain, discharge, redness, itching and visual disturbance. Respiratory: Negative for cough, choking, chest tightness, shortness of breath, wheezing and stridor.  Cardiovascular: Negative for chest pain, palpitations and leg swelling. Gastrointestinal: Negative for abdominal distention. Genitourinary: Negative for dysuria, urgency, frequency, hematuria, flank pain, decreased urine volume, difficulty urinating and dyspareunia.  Musculoskeletal: Negative for back pain, joint swelling, arthralgias and gait problem. Neurological: Negative for dizziness, tremors, seizures, syncope, facial asymmetry, speech difficulty, weakness, light-headedness, numbness and headaches.  Hematological: Negative for adenopathy. Does not bruise/bleed easily. Psychiatric/Behavioral: Negative for hallucinations, behavioral problems, confusion, dysphoric mood, decreased concentration and agitation.    Objective:   Filed Vitals:   04/29/15 1031  BP: 175/114  Pulse: 67  Temp: 98 F (36.7 C)  Resp: 16    Physical Exam  Constitutional: She is oriented to person, place, and time.  Neck: No JVD present.  Cardiovascular: Normal rate, regular rhythm and normal heart sounds.   Pulmonary/Chest: Effort normal and breath sounds normal.  Musculoskeletal: She exhibits no edema.  Neurological: She is alert and oriented to person, place, and time.  Skin: Skin is warm and dry.  Psychiatric: She has a normal mood and affect.     Lab Results  Component  Value Date   WBC 5.3 11/06/2013   HGB 13.4 11/06/2013   HCT 41.3 11/06/2013   MCV 75.2* 11/06/2013   PLT 209 11/06/2013   Lab Results  Component Value Date   CREATININE 0.97 11/06/2013   BUN 21 11/06/2013   NA 138 11/06/2013   K 3.7 11/06/2013   CL 98 11/06/2013   CO2 28 11/06/2013     Lab Results  Component Value Date   HGBA1C 6.2* 11/06/2013   Lipid Panel     Component Value Date/Time   CHOL 242* 11/06/2013 0918   TRIG 101 11/06/2013 0918   HDL 51 11/06/2013 0918   CHOLHDL 4.7 11/06/2013 0918   VLDL 20 11/06/2013 0918   LDLCALC 171* 11/06/2013 0918       Assessment and plan:   Malachi BondsGloria was seen today for follow-up.  Diagnoses and all orders for this visit:  Essential hypertension -     hydrochlorothiazide (HYDRODIURIL) 25 MG tablet; Take 1 tablet (25 mg total) by mouth daily. Blood pressure is severely elevated. I have went over long term complications of HTN. Advised DASH diet and smoking cessation. Stressed that she will need to come back in 2 weeks to have her pressures rechecked for possible medication changes.   Elevated blood pressure -     cloNIDine (CATAPRES) tablet 0.1 mg; Take 1 tablet (0.1 mg total) by mouth once in office. Pressure rechecked and she is stable for discharge.   HLD (hyperlipidemia) -     lovastatin (MEVACOR) 20 MG tablet; Take 1 tablet (20 mg total) by mouth at bedtime. Cholesterol is really elevated, no change from 2 years ago. Please go over things that increase cholesterol levels such as breads pasta, rice, butters, fried foods, etc. I have prescribed her Lipitor 20 mg to take every evening with dinner. Please explain that high cholesterol places her at risk for stroke and heart disease Will recheck lipid panel in 2 weeks when she returns for BP check  Tobacco use disorder Smoking cessation discussed for 3 minutes, patient is not willing to quit at this time. Will continue to assess on each visit. Discussed increased risk for diseases such as cancer, heart disease, and stroke.   Breast cancer screening, high risk patient -     MM Digital Screening; Future Stressed importance of early screening and detection  Need for prophylactic vaccination against Streptococcus pneumoniae (pneumococcus) -     Pneumococcal conjugate  vaccine 13-valent Given in office  Preventative health care -     Ambulatory referral to Dentistry -     Ambulatory referral to Optometry  Colon cancer screening -     Ambulatory referral to Gastroenterology Patient is high risk due to history of tobacco use  Return in about 2 weeks (around 05/13/2015) for Nurse Visit-BP check/Lab visit and 3 mo PCP .       Ambrose FinlandValerie A Keck, NP-C [facility] [phone] 04/29/2015, 10:46 AM

## 2015-04-29 NOTE — Progress Notes (Signed)
Patient here for follow up on her HTN Patient presents in office with elevated blood pressure catapress 0.1mg  given per office protocol Patient states she has been out of her medications for over a week

## 2015-04-29 NOTE — Patient Instructions (Signed)
Smoking Cessation, Tips for Success If you are ready to quit smoking, congratulations! You have chosen to help yourself be healthier. Cigarettes bring nicotine, tar, carbon monoxide, and other irritants into your body. Your lungs, heart, and blood vessels will be able to work better without these poisons. There are many different ways to quit smoking. Nicotine gum, nicotine patches, a nicotine inhaler, or nicotine nasal spray can help with physical craving. Hypnosis, support groups, and medicines help break the habit of smoking. WHAT THINGS CAN I DO TO MAKE QUITTING EASIER?  Here are some tips to help you quit for good:  Pick a date when you will quit smoking completely. Tell all of your friends and family about your plan to quit on that date.  Do not try to slowly cut down on the number of cigarettes you are smoking. Pick a quit date and quit smoking completely starting on that day.  Throw away all cigarettes.   Clean and remove all ashtrays from your home, work, and car.  On a card, write down your reasons for quitting. Carry the card with you and read it when you get the urge to smoke.  Cleanse your body of nicotine. Drink enough water and fluids to keep your urine clear or pale yellow. Do this after quitting to flush the nicotine from your body.  Learn to predict your moods. Do not let a bad situation be your excuse to have a cigarette. Some situations in your life might tempt you into wanting a cigarette.  Never have "just one" cigarette. It leads to wanting another and another. Remind yourself of your decision to quit.  Change habits associated with smoking. If you smoked while driving or when feeling stressed, try other activities to replace smoking. Stand up when drinking your coffee. Brush your teeth after eating. Sit in a different chair when you read the paper. Avoid alcohol while trying to quit, and try to drink fewer caffeinated beverages. Alcohol and caffeine may urge you to  smoke.  Avoid foods and drinks that can trigger a desire to smoke, such as sugary or spicy foods and alcohol.  Ask people who smoke not to smoke around you.  Have something planned to do right after eating or having a cup of coffee. For example, plan to take a walk or exercise.  Try a relaxation exercise to calm you down and decrease your stress. Remember, you may be tense and nervous for the first 2 weeks after you quit, but this will pass.  Find new activities to keep your hands busy. Play with a pen, coin, or rubber band. Doodle or draw things on paper.  Brush your teeth right after eating. This will help cut down on the craving for the taste of tobacco after meals. You can also try mouthwash.   Use oral substitutes in place of cigarettes. Try using lemon drops, carrots, cinnamon sticks, or chewing gum. Keep them handy so they are available when you have the urge to smoke.  When you have the urge to smoke, try deep breathing.  Designate your home as a nonsmoking area.  If you are a heavy smoker, ask your health care provider about a prescription for nicotine chewing gum. It can ease your withdrawal from nicotine.  Reward yourself. Set aside the cigarette money you save and buy yourself something nice.  Look for support from others. Join a support group or smoking cessation program. Ask someone at home or at work to help you with your plan   to quit smoking.  Always ask yourself, "Do I need this cigarette or is this just a reflex?" Tell yourself, "Today, I choose not to smoke," or "I do not want to smoke." You are reminding yourself of your decision to quit.  Do not replace cigarette smoking with electronic cigarettes (commonly called e-cigarettes). The safety of e-cigarettes is unknown, and some may contain harmful chemicals.  If you relapse, do not give up! Plan ahead and think about what you will do the next time you get the urge to smoke. HOW WILL I FEEL WHEN I QUIT SMOKING? You  may have symptoms of withdrawal because your body is used to nicotine (the addictive substance in cigarettes). You may crave cigarettes, be irritable, feel very hungry, cough often, get headaches, or have difficulty concentrating. The withdrawal symptoms are only temporary. They are strongest when you first quit but will go away within 10-14 days. When withdrawal symptoms occur, stay in control. Think about your reasons for quitting. Remind yourself that these are signs that your body is healing and getting used to being without cigarettes. Remember that withdrawal symptoms are easier to treat than the major diseases that smoking can cause.  Even after the withdrawal is over, expect periodic urges to smoke. However, these cravings are generally short lived and will go away whether you smoke or not. Do not smoke! WHAT RESOURCES ARE AVAILABLE TO HELP ME QUIT SMOKING? Your health care provider can direct you to community resources or hospitals for support, which may include:  Group support.  Education.  Hypnosis.  Therapy.   This information is not intended to replace advice given to you by your health care provider. Make sure you discuss any questions you have with your health care provider.   Document Released: 03/09/2004 Document Revised: 07/02/2014 Document Reviewed: 11/27/2012 Elsevier Interactive Patient Education 2016 [employer]  

## 2015-05-26 ENCOUNTER — Ambulatory Visit: Payer: Medicare Other | Attending: Internal Medicine | Admitting: Pharmacist

## 2015-05-26 VITALS — BP 144/92 | HR 74

## 2015-05-26 DIAGNOSIS — I10 Essential (primary) hypertension: Secondary | ICD-10-CM | POA: Insufficient documentation

## 2015-05-26 DIAGNOSIS — F17210 Nicotine dependence, cigarettes, uncomplicated: Secondary | ICD-10-CM | POA: Diagnosis not present

## 2015-05-26 MED ORDER — AMLODIPINE BESYLATE 5 MG PO TABS: 5.0000 mg | ORAL_TABLET | Freq: Every day | ORAL | Status: DC

## 2015-05-26 NOTE — Patient Instructions (Signed)
Thank you for coming to see me today!  Quitting smoking is the most important thing that you can do for your health - please let us know if we can help you in any way  Continue taking hydrochlorothiazide every day   Start amlodipine 5 mg daily and come back in 2 weeks for a blood pressure check

## 2015-05-26 NOTE — Progress Notes (Signed)
S:    Patient arrives in good spirits.    Presents to the clinic for hypertension evaluation.   Patient reports adherence with medications. She reports that she took her hydrochlorothiazide this morning around 7 am,  Current BP Medications include:  Hydrochlorothiazide 25 mg daily.   Antihypertensives tried in the past include: none  Patient reports smoking just prior to walking in for appointment.    O:   Last 3 Office BP readings: BP Readings from Last 3 Encounters:  04/29/15 148/88  04/02/14 130/85  10/23/13 168/96    BMET    Component Value Date/Time   NA 138 11/06/2013 0918   K 3.7 11/06/2013 0918   CL 98 11/06/2013 0918   CO2 28 11/06/2013 0918   GLUCOSE 78 11/06/2013 0918   BUN 21 11/06/2013 0918   CREATININE 0.97 11/06/2013 0918   CREATININE 1.20* 04/07/2013 1250   CALCIUM 9.4 11/06/2013 0918   GFRNONAA 62 11/06/2013 0918   GFRAA 71 11/06/2013 0918    A/P: History of hypertension currently UNcontrolled on hydrochlorothiazide 25 mg daily. Administered clonidine 0.1 mg x 1 per protocol in clinic and blood pressure was greatly reduced. Initiated amlodipine 5 mg daily to hopefully bring blood pressure much closer to goal. Educated patient on medication, including use and adverse effects. Stressed the importance of getting blood pressure to goal. Also told patient that the most important thing that she could do for her health is to quit smoking. Patient verbalized understanding but is not ready to quit at this time.   Results reviewed and written information provided.   Total time in face-to-face counseling 35 minutes.  F/U Clinic Visit with me in 2 weeks for a blood pressure check.

## 2015-06-09 ENCOUNTER — Ambulatory Visit: Payer: Medicare Other | Attending: Internal Medicine | Admitting: Pharmacist

## 2015-06-09 ENCOUNTER — Encounter: Payer: Self-pay | Admitting: Pharmacist

## 2015-06-09 VITALS — BP 134/86 | HR 74

## 2015-06-09 DIAGNOSIS — I10 Essential (primary) hypertension: Secondary | ICD-10-CM | POA: Diagnosis present

## 2015-06-09 DIAGNOSIS — Z79899 Other long term (current) drug therapy: Secondary | ICD-10-CM | POA: Insufficient documentation

## 2015-06-09 DIAGNOSIS — F17210 Nicotine dependence, cigarettes, uncomplicated: Secondary | ICD-10-CM | POA: Diagnosis not present

## 2015-06-09 NOTE — Patient Instructions (Addendum)
Thanks for coming to see me today!  Drink plenty of fluids - that hot tea can help you with the stuffiness. Take your cold medicine and cough drops.  Do not take Sudafed or pseudoephedrine - this can increase your blood pressure.   Follow up with [facility] as directed     Hypertension Hypertension is another name for high blood pressure. High blood pressure forces your heart to work harder to pump blood. A blood pressure reading has two numbers, which includes a higher number over a lower number (example: 110/72). HOME CARE   Have your blood pressure rechecked by your doctor.  Only take medicine as told by your doctor. Follow the directions carefully. The medicine does not work as well if you skip doses. Skipping doses also puts you at risk for problems.  Do not smoke.  Monitor your blood pressure at home as told by your doctor. GET HELP IF:  You think you are having a reaction to the medicine you are taking.  You have repeat headaches or feel dizzy.  You have puffiness (swelling) in your ankles.  You have trouble with your vision. GET HELP RIGHT AWAY IF:   You get a very bad headache and are confused.  You feel weak, numb, or faint.  You get chest or belly (abdominal) pain.  You throw up (vomit).  You cannot breathe very well. MAKE SURE YOU:   Understand these instructions.  Will watch your condition.  Will get help right away if you are not doing well or get worse.   This information is not intended to replace advice given to you by your health care provider. Make sure you discuss any questions you have with your health care provider.   Document Released: 11/28/2007 Document Revised: 06/16/2013 Document Reviewed: 04/03/2013 Elsevier Interactive Patient Education [employer]2016 [employer]

## 2015-06-09 NOTE — Progress Notes (Signed)
S:    Patient arrives in good spirits but has a cold.    Presents to the clinic for hypertension evaluation.   Patient reports adherence with medications. She reports that she took her hydrochlorothiazide and amlodipine this morning.  Current BP Medications include:  Hydrochlorothiazide 25 mg daily and amlodipine 10 mg daily.  Patient reports smoking just prior to walking in for appointment.   She reports that she got a cold that started a few days ago. She has been taking Alka-Seltzer Plus but would like to know if there is anything she should or should not take.   O:   Last 3 Office BP readings: BP Readings from Last 3 Encounters:  06/09/15 134/86  05/26/15 144/92  04/29/15 148/88    BMET    Component Value Date/Time   NA 138 11/06/2013 0918   K 3.7 11/06/2013 0918   CL 98 11/06/2013 0918   CO2 28 11/06/2013 0918   GLUCOSE 78 11/06/2013 0918   BUN 21 11/06/2013 0918   CREATININE 0.97 11/06/2013 0918   CREATININE 1.20* 04/07/2013 1250   CALCIUM 9.4 11/06/2013 0918   GFRNONAA 62 11/06/2013 0918   GFRAA 71 11/06/2013 0918    A/P: History of hypertension currently controlled on hydrochlorothiazide 25 mg daily and amlodipine 5 mg daily. I think it would be even better if she didn't have the cold. Continue hydrochlorothiazide 25 mg daily and amlodipine 5 mg daily. Encouraged patient to get refills on time and to take the medications every day. Also educated patient on pharmacologic and nonpharmacologic treatments for cold. Instructed patient to not take pseudoephedrine as it can increase blood pressure. Patient verbalized understanding.  Results reviewed and written information provided.   Total time in face-to-face counseling 20 minutes.  F/U Clinic Visit [facility], NP, as directed.

## 2015-07-15 MED FILL — AMLODIPINE BESYLATE 5 MG TA: 5 | 30 days supply | Qty: 30 | Fill #1

## 2015-07-15 MED FILL — HYDROCHLOROTHIAZIDE 25 MG T: 25 | 30 days supply | Qty: 30 | Fill #2

## 2015-07-15 MED FILL — ?LOVASTATIN 20MG TABLET: 20 | 30 days supply | Qty: 30 | Fill #2

## 2015-08-11 ENCOUNTER — Ambulatory Visit
Admission: RE | Admit: 2015-08-11 | Discharge: 2015-08-11 | Disposition: A | Discharge status: Home / Self Care | Payer: Medicare HMO | Source: Ambulatory Visit | Attending: Internal Medicine | Admitting: Internal Medicine

## 2015-08-11 DIAGNOSIS — Z1239 Encounter for other screening for malignant neoplasm of breast: Secondary | ICD-10-CM

## 2015-08-12 ENCOUNTER — Telehealth: Payer: Self-pay

## 2015-08-12 NOTE — Telephone Encounter (Signed)
Spoke with patient and she is aware of her normal mammogram 

## 2015-08-12 NOTE — Telephone Encounter (Signed)
-----   Message from Ambrose Finland, NP sent at 08/12/2015  9:22 AM EST ----- Normal mammogram

## 2015-10-20 MED FILL — ?HYDROCHLOROTHIAZIDE 25 MG: 25 MG | 30 days supply | Qty: 30 | Fill #3

## 2015-10-20 MED FILL — ?LOVASTATIN 20MG TABLET: 20 | 30 days supply | Qty: 30 | Fill #3

## 2015-10-21 MED FILL — AMLODIPINE BESYLATE 5 MG TA: 5 | 30 days supply | Qty: 30 | Fill #2

## 2016-01-27 MED FILL — ?AMLODIPINE BESYLATE 5 MG T: 5 | 30 days supply | Qty: 30 | Fill #3

## 2016-01-27 MED FILL — LOVASTATIN 20 MG TABLET: 20 | 30 days supply | Qty: 30 | Fill #4

## 2016-01-27 MED FILL — HYDROCHLOROTHIAZIDE 25 MG T: 25 | 30 days supply | Qty: 30 | Fill #4

## 2016-04-12 ENCOUNTER — Other Ambulatory Visit: Payer: Self-pay | Admitting: Internal Medicine

## 2016-04-12 DIAGNOSIS — I10 Essential (primary) hypertension: Secondary | ICD-10-CM

## 2016-04-12 DIAGNOSIS — E785 Hyperlipidemia, unspecified: Secondary | ICD-10-CM

## 2016-06-14 ENCOUNTER — Ambulatory Visit: Payer: Medicare HMO | Attending: Family Medicine | Admitting: Family Medicine

## 2016-06-14 ENCOUNTER — Encounter: Payer: Self-pay | Admitting: Family Medicine

## 2016-06-14 VITALS — BP 159/104 | HR 64 | Temp 98.4°F | Resp 16 | Wt 199.8 lb

## 2016-06-14 DIAGNOSIS — Z79899 Other long term (current) drug therapy: Secondary | ICD-10-CM | POA: Diagnosis not present

## 2016-06-14 DIAGNOSIS — I10 Essential (primary) hypertension: Secondary | ICD-10-CM | POA: Diagnosis not present

## 2016-06-14 DIAGNOSIS — F17200 Nicotine dependence, unspecified, uncomplicated: Secondary | ICD-10-CM

## 2016-06-14 DIAGNOSIS — E7800 Pure hypercholesterolemia, unspecified: Secondary | ICD-10-CM

## 2016-06-14 DIAGNOSIS — Z0000 Encounter for general adult medical examination without abnormal findings: Secondary | ICD-10-CM | POA: Diagnosis not present

## 2016-06-14 DIAGNOSIS — Z7982 Long term (current) use of aspirin: Secondary | ICD-10-CM | POA: Diagnosis not present

## 2016-06-14 DIAGNOSIS — Z23 Encounter for immunization: Secondary | ICD-10-CM

## 2016-06-14 LAB — BASIC METABOLIC PANEL
BUN: 25 mg/dL (ref 7–25)
CO2: 24 mmol/L (ref 20–31)
Calcium: 8.9 mg/dL (ref 8.6–10.4)
Chloride: 106 mmol/L (ref 98–110)
Creat: 1.04 mg/dL — ABNORMAL HIGH (ref 0.50–0.99)
Glucose, Bld: 73 mg/dL (ref 65–99)
Potassium: 4 mmol/L (ref 3.5–5.3)
SODIUM: 141 mmol/L (ref 135–146)

## 2016-06-14 MED ORDER — LOVASTATIN 20 MG PO TABS: 20.0000 mg | ORAL_TABLET | Freq: Every day | ORAL | 2 refills | Status: DC

## 2016-06-14 MED ORDER — NICOTINE POLACRILEX 4 MG MT GUM: 4.0000 mg | CHEWING_GUM | OROMUCOSAL | 0 refills | Status: DC | PRN

## 2016-06-14 MED ORDER — HYDROCHLOROTHIAZIDE 25 MG PO TABS: 25.0000 mg | ORAL_TABLET | Freq: Every day | ORAL | 2 refills | Status: DC

## 2016-06-14 MED ORDER — AMLODIPINE BESYLATE 5 MG PO TABS: 5.0000 mg | ORAL_TABLET | Freq: Every day | ORAL | 2 refills | Status: DC

## 2016-06-14 MED FILL — HYDROCHLOROTHIAZIDE 25 MG T: 25 | 30 days supply | Qty: 30 | Fill #0

## 2016-06-14 MED FILL — LOVASTATIN 20 MG TABLET: 20 | 30 days supply | Qty: 30 | Fill #0

## 2016-06-14 MED FILL — AMLODIPINE BESYLATE 5 MG TA: 5 | 30 days supply | Qty: 30 | Fill #0

## 2016-06-14 NOTE — Progress Notes (Signed)
Subjective:  Patient ID: Rebecca Pope Recine, female    DOB: 1951/05/22  Age: 65 y.o. MRN: [id number]  CC: Hypertension and Establish Care   HPI Rebecca Pope Pasternak comes to the office to establish care for HTN follow up. She denies any CP, SOB, swelling of the extremities, or headaches. She reports needing a refill for anti-hypertensive medications.She brings a list with her from her home health nurse. List requested information for advance directives/living will, lung cancer screening, and otc debrox drops. She denies any difficulty hearing. Pt.is a current smoker and reports smoking again for 3 years after quitting. She reports a 7 year total smoking history and doesn't meet the criteria for screening. Smoking cessation discussed and patient requested nicotine gum.       Outpatient Medications Prior to Visit  Medication Sig Dispense Refill  . aspirin 325 MG EC tablet Take 325 mg by mouth every 6 (six) hours as needed for pain (headache).    Marland Kitchen. amLODipine (NORVASC) 5 MG tablet Take 1 tablet (5 mg total) by mouth daily. 90 tablet 3  . hydrochlorothiazide (HYDRODIURIL) 25 MG tablet TAKE 1 TABLET BY MOUTH DAILY. 30 tablet 0  . lovastatin (MEVACOR) 20 MG tablet TAKE 1 TABLET BY MOUTH AT BEDTIME. 30 tablet 0   No facility-administered medications prior to visit.     ROS Review of Systems  Respiratory: Negative.   Cardiovascular: Negative.   Gastrointestinal: Negative.   Neurological: Negative for headaches.    Objective:  BP (!) 159/104 (BP Location: Right Arm, Patient Position: Sitting, Cuff Size: Small)   Pulse 64   Temp 98.4 F (36.9 C) (Oral)   Resp 16   Wt 199 lb 12.8 oz (90.6 kg)   SpO2 95%   BMI 34.30 kg/m   BP/Weight 06/14/2016 06/09/2015 05/26/2015  Systolic BP 159 134 144  Diastolic BP 104 86 92  Wt. (Lbs) 199.8 - -  BMI 34.3 - -   Physical Exam  Constitutional: She is oriented to person, place, and time. She appears well-developed and well-nourished.  Neck: No JVD  present.  Cardiovascular: Normal rate, regular rhythm and normal heart sounds.   Pulmonary/Chest: Effort normal and breath sounds normal.  Abdominal: Soft. Bowel sounds are normal. She exhibits no distension. There is no tenderness.  Neurological: She is alert and oriented to person, place, and time.  Skin: Skin is warm and dry.  Psychiatric: She has a normal mood and affect. Her behavior is normal. Thought content normal.     Assessment & Plan:   Problem List Items Addressed This Visit      Cardiovascular and Mediastinum   Hypertension   Relevant Medications   amLODipine (NORVASC) 5 MG tablet   hydrochlorothiazide (HYDRODIURIL) 25 MG tablet   lovastatin (MEVACOR) 20 MG tablet   Other Relevant Orders   Basic Metabolic Panel   Lipid Panel   Microalbumin / creatinine urine ratio     Other   HYPERCHOLESTEROLEMIA   Relevant Medications   amLODipine (NORVASC) 5 MG tablet   hydrochlorothiazide (HYDRODIURIL) 25 MG tablet   lovastatin (MEVACOR) 20 MG tablet   TOBACCO DEPENDENCE   Relevant Medications   nicotine polacrilex (EQ NICOTINE POLACRILEX) 4 MG gum    Other Visit Diagnoses    Health care maintenance    -  Primary   Relevant Orders   Hepatitis C Antibody   Pneumococcal polysaccharide vaccine 23-valent greater than or equal to 2yo subcutaneous/IM     Meds ordered this encounter  Medications  .  amLODipine (NORVASC) 5 MG tablet    Sig: Take 1 tablet (5 mg total) by mouth daily.    Dispense:  30 tablet    Refill:  2    Order Specific Question:   Supervising Provider    Answer:   Quentin AngstJEGEDE, OLUGBEMIGA E [id number]  . hydrochlorothiazide (HYDRODIURIL) 25 MG tablet    Sig: Take 1 tablet (25 mg total) by mouth daily.    Dispense:  30 tablet    Refill:  2    Order Specific Question:   Supervising Provider    Answer:   Quentin AngstJEGEDE, OLUGBEMIGA E [id number]  . lovastatin (MEVACOR) 20 MG tablet    Sig: Take 1 tablet (20 mg total) by mouth at bedtime.    Dispense:  30 tablet     Refill:  2    Order Specific Question:   Supervising Provider    Answer:   Quentin AngstJEGEDE, OLUGBEMIGA E [id number]  . nicotine polacrilex (EQ NICOTINE POLACRILEX) 4 MG gum    Sig: Take 1 each (4 mg total) by mouth as needed for smoking cessation.    Dispense:  100 tablet    Refill:  0    Order Specific Question:   Supervising Provider    Answer:   Quentin AngstJEGEDE, OLUGBEMIGA E [[id number]]    Follow-up: Return in about 3 months (around 09/12/2016) for Hypertension .   Lizbeth BarkMandesia R Kemaria Dedic FNP

## 2016-06-14 NOTE — Patient Instructions (Addendum)
Follow up in 1 week for blood pressure check with nurse.  Hypertension Hypertension is another name for high blood pressure. High blood pressure forces your heart to work harder to pump blood. A blood pressure reading has two numbers, which includes a higher number over a lower number (example: 110/72). Follow these instructions at home:  Have your blood pressure rechecked by your doctor.  Only take medicine as told by your doctor. Follow the directions carefully. The medicine does not work as well if you skip doses. Skipping doses also puts you at risk for problems.  Do not smoke.  Monitor your blood pressure at home as told by your doctor. Contact a doctor if:  You think you are having a reaction to the medicine you are taking.  You have repeat headaches or feel dizzy.  You have puffiness (swelling) in your ankles.  You have trouble with your vision. Get help right away if:  You get a very bad headache and are confused.  You feel weak, numb, or faint.  You get chest or belly (abdominal) pain.  You throw up (vomit).  You cannot breathe very well. This information is not intended to replace advice given to you by your health care provider. Make sure you discuss any questions you have with your health care provider. Document Released: 11/28/2007 Document Revised: 11/17/2015 Document Reviewed: 04/03/2013 Elsevier Interactive Patient Education  2017 [employer].  Steps to Quit Smoking Smoking tobacco can be bad for your health. It can also affect almost every organ in your body. Smoking puts you and people around you at risk for many serious long-lasting (chronic) diseases. Quitting smoking is hard, but it is one of the best things that you can do for your health. It is never too late to quit. What are the benefits of quitting smoking? When you quit smoking, you lower your risk for getting serious diseases and conditions. They can include:  Lung cancer or lung  disease.  Heart disease.  Stroke.  Heart attack.  Not being able to have children (infertility).  Weak bones (osteoporosis) and broken bones (fractures). If you have coughing, wheezing, and shortness of breath, those symptoms may get better when you quit. You may also get sick less often. If you are pregnant, quitting smoking can help to lower your chances of having a baby of low birth weight. What can I do to help me quit smoking? Talk with your doctor about what can help you quit smoking. Some things you can do (strategies) include:  Quitting smoking totally, instead of slowly cutting back how much you smoke over a period of time.  Going to in-person counseling. You are more likely to quit if you go to many counseling sessions.  Using resources and support systems, such as:  Online chats with a Veterinary surgeon.  Phone quitlines.  Printed Materials engineer.  Support groups or group counseling.  Text messaging programs.  Mobile phone apps or applications.  Taking medicines. Some of these medicines may have nicotine in them. If you are pregnant or breastfeeding, do not take any medicines to quit smoking unless your doctor says it is okay. Talk with your doctor about counseling or other things that can help you. Talk with your doctor about using more than one strategy at the same time, such as taking medicines while you are also going to in-person counseling. This can help make quitting easier. What things can I do to make it easier to quit? Quitting smoking might feel very hard  at first, but there is a lot that you can do to make it easier. Take these steps:  Talk to your family and friends. Ask them to support and encourage you.  Call phone quitlines, reach out to support groups, or work with a Veterinary surgeoncounselor.  Ask people who smoke to not smoke around you.  Avoid places that make you want (trigger) to smoke, such as:  Bars.  Parties.  Smoke-break areas at work.  Spend time  with people who do not smoke.  Lower the stress in your life. Stress can make you want to smoke. Try these things to help your stress:  Getting regular exercise.  Deep-breathing exercises.  Yoga.  Meditating.  Doing a body scan. To do this, close your eyes, focus on one area of your body at a time from head to toe, and notice which parts of your body are tense. Try to relax the muscles in those areas.  Download or buy apps on your mobile phone or tablet that can help you stick to your quit plan. There are many free apps, such as QuitGuide from the [employer] developer(Centers for Disease Control and Prevention). You can find more support from smokefree.gov and other websites. This information is not intended to replace advice given to you by your health care provider. Make sure you discuss any questions you have with your health care provider. Document Released: 04/07/2009 Document Revised: 02/07/2016 Document Reviewed: 10/26/2014 Elsevier Interactive Patient Education  2017 Elsevier Inc.  Nicotine chewing gum What is this medicine? NICOTINE (NIK oh teen) helps people stop smoking. This medicine replaces the nicotine found in cigarettes and helps to decrease withdrawal effects. It is most effective when used in combination with a stop-smoking program. This medicine may be used for other purposes; ask your health care provider or pharmacist if you have questions. COMMON BRAND NAME(S): NICOrelief, Nicorette What should I tell my health care provider before I take this medicine? They need to know if you have any of these conditions: -diabetes -heart disease, angina, irregular heartbeat or previous heart attack -high blood pressure -lung disease, including asthma -overactive thyroid -pheochromocytoma -seizures or history of seizures -stomach problems or ulcers -an unusual or allergic reaction to nicotine, other medicines, foods, dyes, or preservatives -pregnant or trying to get  pregnant -breast-feeding How should I use this medicine? Chew but do not swallow the gum. Follow the directions that come with the chewing gum. Use exactly as directed. When you feel an urgent desire for a cigarette, chew one piece of gum slowly. Continue chewing until you taste the gum or feel a slight tingling in your mouth. Then, stop chewing and place the gum between your cheek and gum. Wait until the taste or tingling is almost gone then start chewing again. Continue chewing in this manner for about 30 minutes. Slow chewing helps reduce cravings and also helps reduce the chance for heartburn or other gastrointestinal side effects. Talk to your pediatrician regarding the use of this medicine in children. Special care may be needed. Overdosage: If you think you have taken too much of this medicine contact a poison control center or emergency room at once. NOTE: This medicine is only for you. Do not share this medicine with others. What if I miss a dose? This does not apply. Only use the chewing gum when you have a strong desire to smoke. Do not use more than one piece of gum at a time. What may interact with this medicine? -medicines for asthma -medicines  for blood pressure -medicines for mental depression This list may not describe all possible interactions. Give your health care provider a list of all the medicines, herbs, non-prescription drugs, or dietary supplements you use. Also tell them if you smoke, drink alcohol, or use illegal drugs. Some items may interact with your medicine. What should I watch for while using this medicine? Always carry the nicotine gum with you. Do not use more than 30 pieces of gum a day. Too much gum can increase the risk of an overdose. As the urge to smoke gets less, gradually reduce the number of pieces each day over a period of 2 to 3 months. When you are only using 1 or 2 pieces a day, stop using the nicotine gum. You should begin using the nicotine gum the  day you stop smoking. It is okay if you do not succeed with the attempt to quit and have a cigarette. You can still continue your quit attempt and keep using the product as directed. Just throw away your cigarettes and get back to your quit plan. If your mouth gets sore from chewing the gum, suck hard sugarless candy between pieces of gum to help relieve the soreness. Brush your teeth regularly to reduce mouth irritation. If you wear dentures, contact your doctor or health care professional if the gum sticks to your dental work. If you are a diabetic and you quit smoking, the effects of insulin may be increased and you may need to reduce your insulin dose. Check with your doctor or health care professional about how you should adjust your insulin dose. What side effects may I notice from receiving this medicine? Side effects that you should report to your doctor or health care professional as soon as possible: -allergic reactions like skin rash, itching or hives, swelling of the face, lips, or tongue -blisters in mouth -breathing problems -changes in hearing -changes in vision -chest pain -cold sweats -confusion -fast, irregular heartbeat -feeling faint or lightheaded, falls -headache -increased saliva -nausea, vomiting -stomach pain -weakness Side effects that usually do not require medical attention (report to your doctor or health care professional if they continue or are bothersome): -diarrhea -dry mouth -hiccups -irritability -nervousness or restlessness -trouble sleeping or vivid dreams This list may not describe all possible side effects. Call your doctor for medical advice about side effects. You may report side effects to FDA at 1-800-FDA-1088. Where should I keep my medicine? Keep out of the reach of children. Store at room temperature between 15 and 30 degrees C (59 and 86 degrees F). Protect from heat and light. Throw away unused medicine after the expiration date. NOTE:  This sheet is a summary. It may not cover all possible information. If you have questions about this medicine, talk to your doctor, pharmacist, or health care provider.  2017 Elsevier/Gold Standard (2014-12-06 19:37:14) Pneumococcal Polysaccharide Vaccine: What You Need to Know 1. Why get vaccinated? Vaccination can protect older adults (and some children and younger adults) from pneumococcal disease. Pneumococcal disease is caused by bacteria that can spread from person to person through close contact. It can cause ear infections, and it can also lead to more serious infections of the:  Lungs (pneumonia),  Blood (bacteremia), and  Covering of the brain and spinal cord (meningitis). Meningitis can cause deafness and brain damage, and it can be fatal. Anyone can get pneumococcal disease, but children under 71 years of age, people with certain medical conditions, adults over 16 years of age, and cigarette  smokers are at the highest risk. About 18,000 older adults die each year from pneumococcal disease in the [place]. Treatment of pneumococcal infections with penicillin and other drugs used to be more effective. But some strains of the disease have become resistant to these drugs. This makes prevention of the disease, through vaccination, even more important. 2. Pneumococcal polysaccharide vaccine (PPSV23) Pneumococcal polysaccharide vaccine (PPSV23) protects against 23 types of pneumococcal bacteria. It will not prevent all pneumococcal disease. PPSV23 is recommended for:  All adults 72 years of age and older,  Anyone 2 through 66 years of age with certain long-term health problems,  Anyone 2 through 66 years of age with a weakened immune system,  Adults 2 through 66 years of age who smoke cigarettes or have asthma. Most people need only one dose of PPSV. A second dose is recommended for certain high-risk groups. People 90 and older should get a dose even if they have gotten one or  more doses of the vaccine before they turned 65. Your healthcare provider can give you more information about these recommendations. Most healthy adults develop protection within 2 to 3 weeks of getting the shot. 3. Some people should not get this vaccine  Anyone who has had a life-threatening allergic reaction to PPSV should not get another dose.  Anyone who has a severe allergy to any component of PPSV should not receive it. Tell your provider if you have any severe allergies.  Anyone who is moderately or severely ill when the shot is scheduled may be asked to wait until they recover before getting the vaccine. Someone with a mild illness can usually be vaccinated.  Children less than 57 years of age should not receive this vaccine.  There is no evidence that PPSV is harmful to either a pregnant woman or to her fetus. However, as a precaution, women who need the vaccine should be vaccinated before becoming pregnant, if possible. 4. Risks of a vaccine reaction With any medicine, including vaccines, there is a chance of side effects. These are usually mild and go away on their own, but serious reactions are also possible. About half of people who get PPSV have mild side effects, such as redness or pain where the shot is given, which go away within about two days. Less than 1 out of 100 people develop a fever, muscle aches, or more severe local reactions. Problems that could happen after any vaccine:  People sometimes faint after a medical procedure, including vaccination. Sitting or lying down for about 15 minutes can help prevent fainting, and injuries caused by a fall. Tell your doctor if you feel dizzy, or have vision changes or ringing in the ears.  Some people get severe pain in the shoulder and have difficulty moving the arm where a shot was given. This happens very rarely.  Any medication can cause a severe allergic reaction. Such reactions from a vaccine are very rare, estimated at  about 1 in a million doses, and would happen within a few minutes to a few hours after the vaccination. As with any medicine, there is a very remote chance of a vaccine causing a serious injury or death. The safety of vaccines is always being monitored. For more information, visit: http://floyd.org/ 5. What if there is a serious reaction? What should I look for? Look for anything that concerns you, such as signs of a severe allergic reaction, very high fever, or unusual behavior. Signs of a severe allergic reaction can include hives, swelling  of the face and throat, difficulty breathing, a fast heartbeat, dizziness, and weakness. These would usually start a few minutes to a few hours after the vaccination. What should I do? If you think it is a severe allergic reaction or other emergency that can't wait, call 9-1-1 or get to the nearest hospital. Otherwise, call your doctor. Afterward, the reaction should be reported to the Vaccine Adverse Event Reporting System (VAERS). Your doctor might file this report, or you can do it yourself through the VAERS web site at www.vaers.LAgents.nohhs.gov, or by calling 1-903-187-3106. VAERS does not give medical advice. 6. How can I learn more?  Ask your doctor. He or she can give you the vaccine package insert or suggest other sources of information.  Call your local or state health department.  Contact the Centers for Disease Control and Prevention (CDC):  Call (318)595-91961-2361280817 (1-800-CDC-INFO) or  Visit CDC's website at PicCapture.uywww.cdc.gov/vaccines CDC Pneumococcal Polysaccharide Vaccine VIS (10/16/13) This information is not intended to replace advice given to you by your health care provider. Make sure you discuss any questions you have with your health care provider. Document Released: 04/08/2006 Document Revised: 03/01/2016 Document Reviewed: 03/01/2016 Elsevier Interactive Patient Education  2017 [employer]

## 2016-06-15 LAB — MICROALBUMIN / CREATININE URINE RATIO
CREATININE, URINE: 273 mg/dL (ref 20–320)
Microalb Creat Ratio: 42 mcg/mg creat — ABNORMAL HIGH (ref <30)
Microalb, Ur: 11.4 mg/dL

## 2016-06-15 LAB — HEPATITIS C ANTIBODY: HCV AB: REACTIVE — AB

## 2016-06-19 LAB — HEPATITIS C RNA QUANTITATIVE: HCV QUANT: NOT DETECTED [IU]/mL (ref <15)

## 2016-06-20 ENCOUNTER — Other Ambulatory Visit: Payer: Self-pay | Admitting: Family Medicine

## 2016-06-26 ENCOUNTER — Other Ambulatory Visit: Payer: Self-pay | Admitting: Family Medicine

## 2016-06-26 DIAGNOSIS — Z1159 Encounter for screening for other viral diseases: Secondary | ICD-10-CM

## 2016-06-28 ENCOUNTER — Telehealth: Payer: Self-pay | Admitting: *Deleted

## 2016-06-28 NOTE — Telephone Encounter (Signed)
-----   Message from Lizbeth BarkMandesia R Hairston, FNP sent at 06/26/2016  2:30 PM EST ----- -Testing for hepatitis C was inconclusive. Recommend patient coming in this week for repeat hepatitis C screening labs to determine if hepatitis C is truly positive or if previous result was a false positive. Microalbumin/creatinine ratio level was elevated. This tests for protein in your urine that can indicate early signs of kidney damage. Continue to take you medications for blood pressure as prescribed. Will recheck levels again at next follow up appointment for hypertension in about 3 months.

## 2016-06-28 NOTE — Telephone Encounter (Signed)
Patient verified DOB Patient is aware of HEP C being inconclusive. Patient scheduled a recheck on 07/06/16 at 1:30pm. Patient is also aware of micro/albumin ratio being elevated and patient needing a recheck at the 3 month FU. Patient is advised to continue taking BP medications. Patient expressed her understanding and had no further questions at this time.

## 2016-07-06 ENCOUNTER — Other Ambulatory Visit: Payer: Self-pay | Admitting: Family Medicine

## 2016-07-06 ENCOUNTER — Ambulatory Visit: Payer: Medicare Other | Attending: Family Medicine

## 2016-07-06 DIAGNOSIS — I10 Essential (primary) hypertension: Secondary | ICD-10-CM

## 2016-07-06 DIAGNOSIS — Z1159 Encounter for screening for other viral diseases: Secondary | ICD-10-CM | POA: Insufficient documentation

## 2016-07-06 LAB — LIPID PANEL
Cholesterol: 226 mg/dL — ABNORMAL HIGH (ref <200)
HDL: 55 mg/dL (ref >50)
LDL CALC: 149 mg/dL — AB (ref <100)
TRIGLYCERIDES: 108 mg/dL (ref <150)
Total CHOL/HDL Ratio: 4.1 Ratio (ref <5.0)
VLDL: 22 mg/dL (ref <30)

## 2016-07-06 NOTE — Patient Instructions (Signed)
Patient is aware of receiving a FU call regarding results. 

## 2016-07-06 NOTE — Progress Notes (Signed)
Patient denies any rubbing alcohol or latex allergy.  

## 2016-07-07 LAB — HEPATITIS C ANTIBODY
HCV Ab: REACTIVE — AB
HCV Ab: REACTIVE — AB

## 2016-07-10 LAB — HEPATITIS C RNA QUANTITATIVE
HCV QUANT: NOT DETECTED [IU]/mL (ref <15)
HCV Quantitative: NOT DETECTED IU/mL (ref <15)

## 2016-07-13 ENCOUNTER — Other Ambulatory Visit: Payer: Self-pay | Admitting: Family Medicine

## 2016-07-13 DIAGNOSIS — E782 Mixed hyperlipidemia: Secondary | ICD-10-CM

## 2016-07-13 DIAGNOSIS — R768 Other specified abnormal immunological findings in serum: Secondary | ICD-10-CM

## 2016-07-13 MED ORDER — ASPIRIN EC 81 MG PO TBEC: 81.0000 mg | DELAYED_RELEASE_TABLET | Freq: Every day | ORAL | 2 refills | Status: DC

## 2016-07-16 ENCOUNTER — Telehealth: Payer: Self-pay

## 2016-07-16 NOTE — Telephone Encounter (Signed)
-----   Message from Mandesia R Hairston, FNP sent at 07/13/2016  1:28 PM EST ----- -Hepatitis C is positive. Hepatitis C is transmitted by blood and can be spread by sharing drug needles or sex. Hepatitis C if left untreated can cause cirrohosis of the liver. Hepatits C is curable. Referral has been sent to infectious disease for further evaluation. -Cholesterol level was elevated this can increase your risk of heart disease. Continue to take your lovastatin and limit fried food and fatty food intake. Recheck levels in 3 months. 

## 2016-07-16 NOTE — Telephone Encounter (Signed)
-----   Message from Lizbeth BarkMandesia R Hairston, FNP sent at 07/13/2016  1:28 PM EST ----- -Hepatitis C is positive. Hepatitis C is transmitted by blood and can be spread by sharing drug needles or sex. Hepatitis C if left untreated can cause cirrohosis of the liver. Hepatits C is curable. Referral has been sent to infectious disease for further evaluation. -Cholesterol level was elevated this can increase your risk of heart disease. Continue to take your lovastatin and limit fried food and fatty food intake. Recheck levels in 3 months.

## 2016-07-16 NOTE — Telephone Encounter (Signed)
CMA return patient call to go over lab results no answer

## 2016-07-16 NOTE — Telephone Encounter (Signed)
CMA call to go over lab results, no answer but left a VM stating the reason of the call and to call us back

## 2016-07-16 NOTE — Telephone Encounter (Signed)
Patient called and left a VM stating that someone has been trying to reach her and she is calling to speak with a nurse. Please follow up with patient. Thank you.

## 2016-07-16 NOTE — Telephone Encounter (Signed)
Pt returning call from CMA regarding lab results.  Pt states it is best to call after 4

## 2016-07-26 NOTE — Telephone Encounter (Signed)
CMA call to go over lab results   Patient Verify DOB  Patient was aware of having Hepatitis C &* understood  Patient was aware that she has some aspirin meds that need to be pick up

## 2016-08-07 ENCOUNTER — Ambulatory Visit (INDEPENDENT_AMBULATORY_CARE_PROVIDER_SITE_OTHER): Payer: Medicare Other | Admitting: Internal Medicine

## 2016-08-07 ENCOUNTER — Encounter: Payer: Self-pay | Admitting: Internal Medicine

## 2016-08-07 DIAGNOSIS — R768 Other specified abnormal immunological findings in serum: Secondary | ICD-10-CM | POA: Diagnosis not present

## 2016-08-07 NOTE — Progress Notes (Signed)
    [facility]      Reason for Consult:hepatitis C antibody positive    Referring Physician: Dr. Jenelle MagesHairston    Patient ID: Rebecca CageGloria J Pope, female    DOB: 16-Jun-1951, 66 y.o.   MRN: [id number]  HPI:   She is here with recent Ab positive for hepatitis C.  Was tested as part of a screen.  Does not recall any risk factors, no IVDU, no transfusion prior to 1990.  Had an HCV viral load and is negative, including with repeat testing.  Never tested previously.  No known liver problems.  Labs reviewed and recently with repeated HCV RNA that remained negative.      Past Medical History:  Diagnosis Date  . Hypertension     Prior to Admission medications   Medication Sig Start Date End Date Taking? Authorizing Provider  amLODipine (NORVASC) 5 MG tablet Take 1 tablet (5 mg total) by mouth daily. 06/14/16  Yes Lizbeth BarkMandesia R Hairston, FNP  hydrochlorothiazide (HYDRODIURIL) 25 MG tablet Take 1 tablet (25 mg total) by mouth daily. 06/14/16  Yes Lizbeth BarkMandesia R Hairston, FNP  lovastatin (MEVACOR) 20 MG tablet Take 1 tablet (20 mg total) by mouth at bedtime. 06/14/16  Yes Lizbeth BarkMandesia R Hairston, FNP  nicotine polacrilex (EQ NICOTINE POLACRILEX) 4 MG gum Take 1 each (4 mg total) by mouth as needed for smoking cessation. 06/14/16  Yes Lizbeth BarkMandesia R Hairston, FNP  aspirin EC 81 MG tablet Take 1 tablet (81 mg total) by mouth daily. Patient not taking: Reported on 08/07/2016 07/13/16   Lizbeth BarkMandesia R Hairston, FNP    No Known Allergies  Social History  Substance Use Topics  . Smoking status: Current Every Day Smoker    Packs/day: 1.00    Types: Cigarettes  . Smokeless tobacco: Never Used  . Alcohol use No    Family History  Problem Relation Age of Onset  . Cancer Mother   . Hypertension Mother   . Cancer Father   no cirrhosis  Review of Systems  Constitutional: negative for fatigue Musculoskeletal: negative for myalgias and arthralgias All other systems reviewed and are negative     Constitutional: in no apparent distress  Vitals:   08/07/16 1534  BP: 125/81  Pulse: 81  Temp: 98.7 F (37.1 C)   EYES: anicteric Cardiovascular: Cor RRR Respiratory: CTA B; normal respiratory effort  Labs: Lab Results  Component Value Date   WBC 5.3 11/06/2013   HGB 13.4 11/06/2013   HCT 41.3 11/06/2013   MCV 75.2 (L) 11/06/2013   PLT 209 11/06/2013    Lab Results  Component Value Date   CREATININE 1.04 (H) 06/14/2016   BUN 25 06/14/2016   NA 141 06/14/2016   K 4.0 06/14/2016   CL 106 06/14/2016   CO2 24 06/14/2016    Lab Results  Component Value Date   ALT 9 11/06/2013   AST 14 11/06/2013   ALKPHOS 66 11/06/2013   BILITOT 0.3 11/06/2013     Assessment: hepatitis C antibody positive, negative RNA.  I discussed with the patient that she does not have active hepatitis C and that 20% of people infected clear the virus spontaneously in the first year, as is the case for her.  With a limited exposure, there is no liver evaluation required in regards to hepatitis C so no further intervention indicated.    Plan: 1) return PRN, above discussed with the patient 2) I discussed that the Ab will remain positive with blood donations

## 2017-04-05 ENCOUNTER — Ambulatory Visit: Payer: Medicare Other | Attending: Family Medicine | Admitting: Family Medicine

## 2017-04-05 ENCOUNTER — Encounter: Payer: Self-pay | Admitting: Family Medicine

## 2017-04-05 VITALS — BP 138/92 | HR 68 | Temp 98.5°F | Resp 18 | Ht 64.0 in | Wt 200.0 lb

## 2017-04-05 DIAGNOSIS — R1310 Dysphagia, unspecified: Secondary | ICD-10-CM | POA: Insufficient documentation

## 2017-04-05 DIAGNOSIS — K219 Gastro-esophageal reflux disease without esophagitis: Secondary | ICD-10-CM | POA: Insufficient documentation

## 2017-04-05 DIAGNOSIS — Z7982 Long term (current) use of aspirin: Secondary | ICD-10-CM | POA: Diagnosis not present

## 2017-04-05 DIAGNOSIS — Z1239 Encounter for other screening for malignant neoplasm of breast: Secondary | ICD-10-CM

## 2017-04-05 DIAGNOSIS — Z1231 Encounter for screening mammogram for malignant neoplasm of breast: Secondary | ICD-10-CM

## 2017-04-05 DIAGNOSIS — Z79899 Other long term (current) drug therapy: Secondary | ICD-10-CM | POA: Insufficient documentation

## 2017-04-05 DIAGNOSIS — Z1211 Encounter for screening for malignant neoplasm of colon: Secondary | ICD-10-CM

## 2017-04-05 DIAGNOSIS — E782 Mixed hyperlipidemia: Secondary | ICD-10-CM | POA: Insufficient documentation

## 2017-04-05 DIAGNOSIS — I10 Essential (primary) hypertension: Secondary | ICD-10-CM | POA: Insufficient documentation

## 2017-04-05 LAB — POCT UA - MICROALBUMIN
CREATININE, POC: 300 mg/dL
Microalbumin Ur, POC: 150 mg/L

## 2017-04-05 MED ORDER — OMEPRAZOLE 20 MG PO CPDR: 20.0000 mg | DELAYED_RELEASE_CAPSULE | Freq: Every day | ORAL | 3 refills | Status: DC

## 2017-04-05 MED ORDER — AMLODIPINE BESYLATE 5 MG PO TABS: 5.0000 mg | ORAL_TABLET | Freq: Every day | ORAL | 3 refills | Status: DC

## 2017-04-05 MED ORDER — LOVASTATIN 20 MG PO TABS: 20.0000 mg | ORAL_TABLET | Freq: Every day | ORAL | 3 refills | Status: DC

## 2017-04-05 MED ORDER — HYDROCHLOROTHIAZIDE 25 MG PO TABS: 25.0000 mg | ORAL_TABLET | Freq: Every day | ORAL | 3 refills | Status: DC

## 2017-04-05 MED ORDER — ASPIRIN EC 81 MG PO TBEC: 81.0000 mg | DELAYED_RELEASE_TABLET | Freq: Every day | ORAL | 3 refills | Status: DC

## 2017-04-05 MED FILL — AMLODIPINE BESYLATE 5 MG TA: 5 | 30 days supply | Qty: 30 | Fill #0

## 2017-04-05 MED FILL — OMEPRAZOLE DR 20 MG CAPSULE: 20 | 30 days supply | Qty: 30 | Fill #0

## 2017-04-05 MED FILL — HYDROCHLOROTHIAZIDE 25 MG T: 25 | 30 days supply | Qty: 30 | Fill #0

## 2017-04-05 MED FILL — LOVASTATIN 20 MG TABLET: 20 | 30 days supply | Qty: 30 | Fill #0

## 2017-04-05 NOTE — Progress Notes (Signed)
Subjective:  Patient ID: Rebecca Pope, female    DOB: 05-14-1951  Age: 66 y.o. MRN: [id number]  CC: Hypertension   HPI Rebecca Pope presents for HTN follow up. History of HTN.  She is not exercising and is not adherent to low salt diet.  She does not check BP at home. Cardiac symptoms none. Patient denies chest pain, chest pressure/discomfort, claudication, dyspnea, lower extremity edema, near-syncope, palpitations and syncope.  Cardiovascular risk factors: dyslipidemia, hypertension,and sedentary lifestyle. Use of agents associated with hypertension: none. History of target organ damage: none. She complaints of symptom of dysphagia for 1 month. Worse with meats and spicy foods. She does reports heartburn. Not taking anything currently for symptoms.   Outpatient Medications Prior to Visit  Medication Sig Dispense Refill  . nicotine polacrilex (EQ NICOTINE POLACRILEX) 4 MG gum Take 1 each (4 mg total) by mouth as needed for smoking cessation. (Patient not taking: Reported on 04/05/2017) 100 tablet 0  . amLODipine (NORVASC) 5 MG tablet Take 1 tablet (5 mg total) by mouth daily. (Patient not taking: Reported on 04/05/2017) 30 tablet 2  . aspirin EC 81 MG tablet Take 1 tablet (81 mg total) by mouth daily. (Patient not taking: Reported on 08/07/2016) 30 tablet 2  . hydrochlorothiazide (HYDRODIURIL) 25 MG tablet Take 1 tablet (25 mg total) by mouth daily. (Patient not taking: Reported on 04/05/2017) 30 tablet 2  . lovastatin (MEVACOR) 20 MG tablet Take 1 tablet (20 mg total) by mouth at bedtime. (Patient not taking: Reported on 04/05/2017) 30 tablet 2   No facility-administered medications prior to visit.     ROS Review of Systems  Constitutional: Negative.   HENT: Positive for trouble swallowing.   Eyes: Negative.   Respiratory: Negative.   Cardiovascular: Negative.   Gastrointestinal: Negative.   Skin: Negative.    Objective:  BP (!) 138/92 (BP Location: Left Arm, Patient Position:  Sitting, Cuff Size: Normal)   Pulse 68   Temp 98.5 F (36.9 C) (Oral)   Resp 18   Ht  (1.626 m)   Wt 200 lb (90.7 kg)   SpO2 90%   BMI 34.33 kg/m   BP/Weight 04/05/2017 08/07/2016 06/14/2016  Systolic BP 138 125 159  Diastolic BP 92 81 104  Wt. (Lbs) 200 199 199.8  BMI 34.33 34.16 34.3     Physical Exam  Constitutional: She appears well-developed and well-nourished.  HENT:  Head: Normocephalic and atraumatic.  Right Ear: External ear normal.  Left Ear: External ear normal.  Nose: Nose normal.  Mouth/Throat: Oropharynx is clear and moist.  Eyes: Pupils are equal, round, and reactive to light. Conjunctivae are normal.  Neck: Normal range of motion. Neck supple. No JVD present. No thyromegaly present.  Cardiovascular: Normal rate, regular rhythm, normal heart sounds and intact distal pulses.   Pulmonary/Chest: Effort normal and breath sounds normal.  Abdominal: Soft. Bowel sounds are normal. There is no tenderness.  Lymphadenopathy:    She has no cervical adenopathy.  Skin: Skin is warm and dry.  Nursing note and vitals reviewed.    Assessment & Plan:   1. Essential hypertension   - amLODipine (NORVASC) 5 MG tablet; Take 1 tablet (5 mg total) by mouth daily.  Dispense: 30 tablet; Refill: 3 - hydrochlorothiazide (HYDRODIURIL) 25 MG tablet; Take 1 tablet (25 mg total) by mouth daily.  Dispense: 30 tablet; Refill: 3 - POCT UA - Microalbumin  2. Mixed hyperlipidemia   - aspirin EC 81 MG tablet; Take 1  tablet (81 mg total) by mouth daily.  Dispense: 30 tablet; Refill: 3 - lovastatin (MEVACOR) 20 MG tablet; Take 1 tablet (20 mg total) by mouth at bedtime.  Dispense: 30 tablet; Refill: 3  3. Dysphagia, unspecified type   - SLP modified barium swallow; Future - omeprazole (PRILOSEC) 20 MG capsule; Take 1 capsule (20 mg total) by mouth daily.  Dispense: 30 capsule; Refill: 3 - Ambulatory referral to Gastroenterology  4. Gastroesophageal reflux disease, esophagitis  presence not specified   - omeprazole (PRILOSEC) 20 MG capsule; Take 1 capsule (20 mg total) by mouth daily.  Dispense: 30 capsule; Refill: 3  5. Screen for colon cancer   - Ambulatory referral to Gastroenterology  6. Screening breast examination   - MM SCREENING BREAST TOMO BILATERAL; Future   Meds ordered this encounter  Medications  . amLODipine (NORVASC) 5 MG tablet    Sig: Take 1 tablet (5 mg total) by mouth daily.    Dispense:  30 tablet    Refill:  3    Order Specific Question:   Supervising Provider    Answer:   Quentin Angst [id number]  . hydrochlorothiazide (HYDRODIURIL) 25 MG tablet    Sig: Take 1 tablet (25 mg total) by mouth daily.    Dispense:  30 tablet    Refill:  3    Order Specific Question:   Supervising Provider    Answer:   Quentin Angst [id number]  . aspirin EC 81 MG tablet    Sig: Take 1 tablet (81 mg total) by mouth daily.    Dispense:  30 tablet    Refill:  3    Order Specific Question:   Supervising Provider    Answer:   Quentin Angst [id number]  . lovastatin (MEVACOR) 20 MG tablet    Sig: Take 1 tablet (20 mg total) by mouth at bedtime.    Dispense:  30 tablet    Refill:  3    Order Specific Question:   Supervising Provider    Answer:   Quentin Angst [id number]  . omeprazole (PRILOSEC) 20 MG capsule    Sig: Take 1 capsule (20 mg total) by mouth daily.    Dispense:  30 capsule    Refill:  3    Order Specific Question:   Supervising Provider    Answer:   Quentin Angst [id number]    Follow-up: Return in about 3 months (around 07/06/2017), or if symptoms worsen or fail to improve, for HTN/HLD/GERD.   Lizbeth Bark FNP

## 2017-04-05 NOTE — Progress Notes (Signed)
Patient is here for HTN   Patient been without BP med for a month

## 2017-04-17 ENCOUNTER — Encounter: Payer: Self-pay | Admitting: Gastroenterology

## 2017-05-02 ENCOUNTER — Ambulatory Visit
Admission: RE | Admit: 2017-05-02 | Discharge: 2017-05-02 | Disposition: A | Discharge status: Home / Self Care | Payer: Medicare Other | Source: Ambulatory Visit | Attending: Family Medicine | Admitting: Family Medicine

## 2017-05-02 ENCOUNTER — Ambulatory Visit: Payer: Medicare Other

## 2017-05-02 DIAGNOSIS — Z1239 Encounter for other screening for malignant neoplasm of breast: Secondary | ICD-10-CM

## 2017-06-07 MED FILL — HYDROCHLOROTHIAZIDE 25 MG T: 25 | 30 days supply | Qty: 30 | Fill #1

## 2017-06-07 MED FILL — AMLODIPINE BESYLATE 5 MG TA: 5 | 30 days supply | Qty: 30 | Fill #1

## 2017-06-07 MED FILL — OMEPRAZOLE DR 20 MG CAPSULE: 20 | 30 days supply | Qty: 30 | Fill #1

## 2017-06-07 MED FILL — LOVASTATIN 20 MG TABLET: 20 | 30 days supply | Qty: 30 | Fill #1

## 2017-06-14 ENCOUNTER — Ambulatory Visit: Payer: Medicare Other | Admitting: Gastroenterology

## 2017-06-14 NOTE — Progress Notes (Signed)
No show letter sent.

## 2017-06-19 ENCOUNTER — Encounter: Payer: Self-pay | Admitting: Family Medicine

## 2017-07-05 ENCOUNTER — Ambulatory Visit: Payer: Medicare Other | Admitting: Family Medicine

## 2017-11-28 ENCOUNTER — Other Ambulatory Visit: Payer: Self-pay

## 2017-11-28 ENCOUNTER — Ambulatory Visit: Payer: Medicare Other | Attending: Family Medicine | Admitting: Physician Assistant

## 2017-11-28 VITALS — BP 119/80 | HR 68 | Temp 98.6°F | Resp 18 | Ht 64.5 in | Wt 195.0 lb

## 2017-11-28 DIAGNOSIS — Z7982 Long term (current) use of aspirin: Secondary | ICD-10-CM | POA: Diagnosis not present

## 2017-11-28 DIAGNOSIS — Z79899 Other long term (current) drug therapy: Secondary | ICD-10-CM | POA: Insufficient documentation

## 2017-11-28 DIAGNOSIS — F17200 Nicotine dependence, unspecified, uncomplicated: Secondary | ICD-10-CM

## 2017-11-28 DIAGNOSIS — R531 Weakness: Secondary | ICD-10-CM | POA: Diagnosis present

## 2017-11-28 DIAGNOSIS — I4581 Long QT syndrome: Secondary | ICD-10-CM | POA: Insufficient documentation

## 2017-11-28 DIAGNOSIS — R42 Dizziness and giddiness: Secondary | ICD-10-CM | POA: Diagnosis present

## 2017-11-28 DIAGNOSIS — Z131 Encounter for screening for diabetes mellitus: Secondary | ICD-10-CM

## 2017-11-28 DIAGNOSIS — I10 Essential (primary) hypertension: Secondary | ICD-10-CM

## 2017-11-28 LAB — GLUCOSE, POCT (MANUAL RESULT ENTRY): POC GLUCOSE: 111 mg/dL — AB (ref 70–99)

## 2017-11-28 MED ORDER — HYDROCHLOROTHIAZIDE 25 MG PO TABS: 25.0000 mg | ORAL_TABLET | Freq: Every day | ORAL | 3 refills | Status: DC

## 2017-11-28 MED ORDER — AMLODIPINE BESYLATE 5 MG PO TABS: 5.0000 mg | ORAL_TABLET | Freq: Every day | ORAL | 3 refills | Status: DC

## 2017-11-28 MED FILL — AMLODIPINE BESYLATE 5 MG TA: 5 | 30 days supply | Qty: 30 | Fill #0

## 2017-11-28 MED FILL — HYDROCHLOROTHIAZIDE 25 MG T: 25 | 30 days supply | Qty: 30 | Fill #0

## 2017-11-28 NOTE — Patient Instructions (Addendum)
Increase your water intake.  Drink 60-80 ounces of water daily.    Dizziness Dizziness is a common problem. It makes you feel unsteady or light-headed. You may feel like you are about to pass out (faint). Dizziness can lead to getting hurt if you stumble or fall. Dizziness can be caused by many things, including:  Medicines.  Not having enough water in your body (dehydration).  Illness.  Follow these instructions at home: Eating and drinking  Drink enough fluid to keep your pee (urine) clear or pale yellow. This helps to keep you from getting dehydrated. Try to drink more clear fluids, such as water.  Do not drink alcohol.  Limit how much caffeine you drink or eat, if your doctor tells you to do that.  Limit how much salt (sodium) you drink or eat, if your doctor tells you to do that. Activity  Avoid making quick movements. ? When you stand up from sitting in a chair, steady yourself until you feel okay. ? In the morning, first sit up on the side of the bed. When you feel okay, stand slowly while you hold onto something. Do this until you know that your balance is fine.  If you need to stand in one place for a long time, move your legs often. Tighten and relax the muscles in your legs while you are standing.  Do not drive or use heavy machinery if you feel dizzy.  Avoid bending down if you feel dizzy. Place items in your home so you can reach them easily without leaning over. Lifestyle  Do not use any products that contain nicotine or tobacco, such as cigarettes and e-cigarettes. If you need help quitting, ask your doctor.  Try to lower your stress level. You can do this by using methods such as yoga or meditation. Talk with your doctor if you need help. General instructions  Watch your dizziness for any changes.  Take over-the-counter and prescription medicines only as told by your doctor. Talk with your doctor if you think that you are dizzy because of a medicine that you  are taking.  Tell a friend or a family member that you are feeling dizzy. If he or she notices any changes in your behavior, have this person call your doctor.  Keep all follow-up visits as told by your doctor. This is important. Contact a doctor if:  Your dizziness does not go away.  Your dizziness or light-headedness gets worse.  You feel sick to your stomach (nauseous).  You have trouble hearing.  You have new symptoms.  You are unsteady on your feet.  You feel like the room is spinning. Get help right away if:  You throw up (vomit) or have watery poop (diarrhea), and you cannot eat or drink anything.  You have trouble: ? Talking. ? Walking. ? Swallowing. ? Using your arms, hands, or legs.  You feel generally weak.  You are not thinking clearly, or you have trouble forming sentences. A friend or family member may notice this.  You have: ? Chest pain. ? Pain in your belly (abdomen). ? Shortness of breath. ? Sweating.  Your vision changes.  You are bleeding.  You have a very bad headache.  You have neck pain or a stiff neck.  You have a fever. These symptoms may be an emergency. Do not wait to see if the symptoms will go away. Get medical help right away. Call your local emergency services (911 in the [place] Do not drive yourself  to the hospital. Summary  Dizziness makes you feel unsteady or light-headed. You may feel like you are about to pass out (faint).  Drink enough fluid to keep your pee (urine) clear or pale yellow. Do not drink alcohol.  Avoid making quick movements if you feel dizzy.  Watch your dizziness for any changes. This information is not intended to replace advice given to you by your health care provider. Make sure you discuss any questions you have with your health care provider. Document Released: 05/31/2011 Document Revised: 06/28/2016 Document Reviewed: 06/28/2016 Elsevier Interactive Patient Education  2017 [employer]  Smoking  Tobacco Information Smoking tobacco will very likely harm your health. Tobacco contains a poisonous (toxic), colorless chemical called nicotine. Nicotine affects the brain and makes tobacco addictive. This change in your brain can make it hard to stop smoking. Tobacco also has other toxic chemicals that can hurt your body and raise your risk of many cancers. How can smoking tobacco affect me? Smoking tobacco can increase your chances of having serious health conditions, such as:  Cancer. Smoking is most commonly associated with lung cancer, but can lead to cancer in other parts of the body.  Chronic obstructive pulmonary disease (COPD). This is a long-term lung condition that makes it hard to breathe. It also gets worse over time.  High blood pressure (hypertension), heart disease, stroke, or heart attack.  Lung infections, such as pneumonia.  Cataracts. This is when the lenses in the eyes become clouded.  Digestive problems. This may include peptic ulcers, heartburn, and gastroesophageal reflux disease (GERD).  Oral health problems, such as gum disease and tooth loss.  Loss of taste and smell.  Smoking can affect your appearance by causing:  Wrinkles.  Yellow or stained teeth, fingers, and fingernails.  Smoking tobacco can also affect your social life.  Many workplaces, [employer], hotels, and public places are tobacco-free. This means that you may experience challenges in finding places to smoke when away from home.  The cost of a smoking habit can be expensive. Expenses for someone who smokes come in two ways: ? You spend money on a regular basis to buy tobacco. ? Your health care costs in the long-term are higher if you smoke.  Tobacco smoke can also affect the health of those around you. Children of smokers have greater chances of: ? Sudden infant death syndrome (SIDS). ? Ear infections. ? Lung infections.  What lifestyle changes can be made?  Do not start smoking.  Quit if you already do.  To quit smoking: ? Make a plan to quit smoking and commit yourself to it. Look for programs to help you and ask your health care provider for recommendations and ideas. ? Talk with your health care provider about using nicotine replacement medicines to help you quit. Medicine replacement medicines include gum, lozenges, patches, sprays, or pills. ? Do not replace cigarette smoking with electronic cigarettes, which are commonly called e-cigarettes. The safety of e-cigarettes is not known, and some may contain harmful chemicals. ? Avoid places, people, or situations that tempt you to smoke. ? If you try to quit but return to smoking, don't give up hope. It is very common for people to try a number of times before they fully succeed. When you feel ready again, give it another try.  Quitting smoking might affect the way you eat as well as your weight. Be prepared to monitor your eating habits. Get support in planning and following a healthy diet.  Ask your health  care provider about having regular tests (screenings) to check for cancer. This may include blood tests, imaging tests, and other tests.  Exercise regularly. Consider taking walks, joining a gym, or doing yoga or exercise classes.  Develop skills to manage your stress. These skills include meditation. What are the benefits of quitting smoking? By quitting smoking, you may:  Lower your risk of getting cancer and other diseases caused by smoking.  Live longer.  Breathe better.  Lower your blood pressure and heart rate.  Stop your addiction to tobacco.  Stop creating secondhand smoke that hurts other people.  Improve your sense of taste and smell.  Look better over time, due to having fewer wrinkles and less staining.  What can happen if changes are not made? If you do not stop smoking, you may:  Get cancer and other diseases.  Develop COPD or other long-term (chronic) lung conditions.  Develop  serious problems with your heart and blood vessels (cardiovascular system).  Need more tests to screen for problems caused by smoking.  Have higher, long-term healthcare costs from medicines or treatments related to smoking.  Continue to have worsening changes in your lungs, mouth, and nose.  Where to find support: To get support to quit smoking, consider:  Asking your health care provider for more information and resources.  Taking classes to learn more about quitting smoking.  Looking for local organizations that offer resources about quitting smoking.  Joining a support group for people who want to quit smoking in your local community.  Where to find more information: You may find more information about quitting smoking from:  HelpGuide.org: www.helpguide.org/articles/addictions/how-to-quit-smoking.htm  [employer]: smokefree.gov  American Lung Association: www.lung.org  Contact a health care provider if:  You have problems breathing.  Your lips, nose, or fingers turn blue.  You have chest pain.  You are coughing up blood.  You feel faint or you pass out.  You have other noticeable changes that cause you to worry. Summary  Smoking tobacco can negatively affect your health, the health of those around you, your finances, and your social life.  Do not start smoking. Quit if you already do. If you need help quitting, ask your health care provider.  Think about joining a support group for people who want to quit smoking in your local community. There are many effective programs that will help you to quit this behavior. This information is not intended to replace advice given to you by your health care provider. Make sure you discuss any questions you have with your health care provider. Document Released: 06/26/2016 Document Revised: 06/26/2016 Document Reviewed: 06/26/2016 Elsevier Interactive Patient Education  [employer].

## 2017-11-28 NOTE — Progress Notes (Signed)
Patient ID: Rebecca Pope, female   DOB: July 29, 1950, 67 y.o.   MRN: [id number]      Rebecca LaurenceGloria Pope, is a 67 y.o. female  [id number]  [id number]  DOB - July 29, 1950  Subjective:  Chief Complaint and HPI: Rebecca Pope is a 67 y.o. female here today for Several days she has felt light headed and dizzy and feels weak.  Denies CP, SOB.  No new cough.  Compliant with meds.  No change in appetite.  No N/V/D.  +smoker. No alleviating or precipitating factors.  Symptoms are present when she is trying to do daily activities but not present at rest.  Feels tired often.     ROS:   Constitutional:  No f/c, No night sweats, No unexplained weight loss. EENT:  No vision changes, No blurry vision, No hearing changes. No mouth, throat, or ear problems.  Respiratory: No cough, No SOB Cardiac: No CP, no palpitations GI:  No abd pain, No N/V/D. GU: No Urinary s/sx Musculoskeletal: No joint pain Neuro: No headache, + dizziness, no motor weakness.  Skin: No rash Endocrine:  No polydipsia. No polyuria.  Psych: Denies SI/HI  No problems updated.  ALLERGIES: No Known Allergies  PAST MEDICAL HISTORY: Past Medical History:  Diagnosis Date  . Hypertension     MEDICATIONS AT HOME: Prior to Admission medications   Medication Sig Start Date End Date Taking? Authorizing Provider  amLODipine (NORVASC) 5 MG tablet Take 1 tablet (5 mg total) by mouth daily. 04/05/17  Yes Lizbeth BarkHairston, Mandesia R, FNP  aspirin EC 81 MG tablet Take 1 tablet (81 mg total) by mouth daily. 04/05/17  Yes Hairston, Oren BeckmannMandesia R, FNP  hydrochlorothiazide (HYDRODIURIL) 25 MG tablet Take 1 tablet (25 mg total) by mouth daily. 04/05/17  Yes Hairston, Oren BeckmannMandesia R, FNP  lovastatin (MEVACOR) 20 MG tablet Take 1 tablet (20 mg total) by mouth at bedtime. 04/05/17  Yes Hairston, Oren BeckmannMandesia R, FNP  omeprazole (PRILOSEC) 20 MG capsule Take 1 capsule (20 mg total) by mouth daily. 04/05/17  Yes Hairston, Oren BeckmannMandesia R, FNP  nicotine polacrilex (EQ  NICOTINE POLACRILEX) 4 MG gum Take 1 each (4 mg total) by mouth as needed for smoking cessation. Patient not taking: Reported on 04/05/2017 06/14/16   Lizbeth BarkHairston, Mandesia R, FNP     Objective:  EXAM:   Vitals:   11/28/17 1513  BP: 119/80  Pulse: 68  Resp: 18  Temp: 98.6 F (37 C)  TempSrc: Oral  SpO2: 95%  Weight: 195 lb (88.5 kg)  Height: 5' 4.5" (1.638 m)    General appearance : A&OX3. NAD. Non-toxic-appearing HEENT: Atraumatic and Normocephalic.  PERRLA. EOM intact.  TM clear B. Mouth-MMM, post pharynx WNL w/o erythema, No PND. Neck: supple, no JVD. No cervical lymphadenopathy. No thyromegaly Chest/Lungs:  Breathing-non-labored, fair air entry bilaterally, breath sounds course without rhonchi, rales, or wheezing.   CVS: S1 S2 regular, no murmurs, gallops, rubs  Extremities: Bilateral Lower Ext shows no edema, both legs are warm to touch with = pulse throughout Neurology:  CN II-XII grossly intact, Non focal.   Psych:  TP linear. J/I WNL. Normal speech. Appropriate eye contact and affect.  Skin:  No Rash  Data Review Lab Results  Component Value Date   HGBA1C 6.2 (H) 11/06/2013     Assessment & Plan   1. Dizziness Unclear etiology.  She drinks no water.  I have advised she drink 60-80 ounces of water daily.  Rest. To ED if any s/sx worsen.  I discussed and reviewed  case and EKG with Dr Helene Shoe - CBC with Differential/Platelet - TSH - Vitamin D, 25-hydroxy - EKG 12-Lead-prolonged QT; no ST changes  2. Screening for diabetes mellitus Not diabetic - Glucose (CBG)  3. Essential hypertension Controlled.  Continue current regimen - Comprehensive metabolic panel - EKG 12-Lead  4. TOBACCO DEPENDENCE Cessation advised.     Patient have been counseled extensively about nutrition and exercise  Return in about 3 months (around 02/28/2018) for assign new PCP; f/up htn and labs.  The patient was given clear instructions to go to ER or return to medical center if  symptoms don't improve, worsen or new problems develop. The patient verbalized understanding. The patient was told to call to get lab results if they haven't heard anything in the next week.     Georgian Co, PA-C [facility] [place], [place] [phone]   11/28/2017, 3:28 PM

## 2017-11-29 ENCOUNTER — Telehealth: Payer: Self-pay | Admitting: *Deleted

## 2017-11-29 ENCOUNTER — Other Ambulatory Visit: Payer: Self-pay | Admitting: Physician Assistant

## 2017-11-29 LAB — CBC WITH DIFFERENTIAL/PLATELET
BASOS: 0 %
Basophils Absolute: 0 10*3/uL (ref 0.0–0.2)
EOS (ABSOLUTE): 0.1 10*3/uL (ref 0.0–0.4)
EOS: 2 %
HEMATOCRIT: 42 % (ref 34.0–46.6)
Hemoglobin: 13.4 g/dL (ref 11.1–15.9)
IMMATURE GRANULOCYTES: 0 %
Immature Grans (Abs): 0 10*3/uL (ref 0.0–0.1)
LYMPHS ABS: 2.9 10*3/uL (ref 0.7–3.1)
Lymphs: 50 %
MCH: 24.3 pg — ABNORMAL LOW (ref 26.6–33.0)
MCHC: 31.9 g/dL (ref 31.5–35.7)
MCV: 76 fL — AB (ref 79–97)
MONOCYTES: 8 %
MONOS ABS: 0.5 10*3/uL (ref 0.1–0.9)
Neutrophils Absolute: 2.3 10*3/uL (ref 1.4–7.0)
Neutrophils: 40 %
Platelets: 245 10*3/uL (ref 150–450)
RBC: 5.51 x10E6/uL — AB (ref 3.77–5.28)
RDW: 15.4 % (ref 12.3–15.4)
WBC: 5.8 10*3/uL (ref 3.4–10.8)

## 2017-11-29 LAB — COMPREHENSIVE METABOLIC PANEL
ALBUMIN: 4.3 g/dL (ref 3.6–4.8)
ALT: 10 IU/L (ref 0–32)
AST: 13 IU/L (ref 0–40)
Albumin/Globulin Ratio: 1.3 (ref 1.2–2.2)
Alkaline Phosphatase: 87 IU/L (ref 39–117)
BUN / CREAT RATIO: 17 (ref 12–28)
BUN: 19 mg/dL (ref 8–27)
Bilirubin Total: <0.2 mg/dL (ref 0.0–1.2)
CALCIUM: 9.5 mg/dL (ref 8.7–10.3)
CO2: 27 mmol/L (ref 20–29)
CREATININE: 1.1 mg/dL — AB (ref 0.57–1.00)
Chloride: 93 mmol/L — ABNORMAL LOW (ref 96–106)
GFR, EST AFRICAN AMERICAN: 60 mL/min/{1.73_m2} (ref >59)
GFR, EST NON AFRICAN AMERICAN: 52 mL/min/{1.73_m2} — AB (ref >59)
GLOBULIN, TOTAL: 3.3 g/dL (ref 1.5–4.5)
GLUCOSE: 83 mg/dL (ref 65–99)
Potassium: 3.2 mmol/L — ABNORMAL LOW (ref 3.5–5.2)
SODIUM: 141 mmol/L (ref 134–144)
TOTAL PROTEIN: 7.6 g/dL (ref 6.0–8.5)

## 2017-11-29 LAB — TSH: TSH: 0.691 u[IU]/mL (ref 0.450–4.500)

## 2017-11-29 LAB — VITAMIN D 25 HYDROXY (VIT D DEFICIENCY, FRACTURES): VIT D 25 HYDROXY: 10 ng/mL — AB (ref 30.0–100.0)

## 2017-11-29 MED ORDER — POTASSIUM CHLORIDE ER 10 MEQ PO TBCR: 10.0000 meq | EXTENDED_RELEASE_TABLET | Freq: Every day | ORAL | 2 refills | Status: DC

## 2017-11-29 MED ORDER — VITAMIN D (ERGOCALCIFEROL) 1.25 MG (50000 UNIT) PO CAPS: 50000.0000 [IU] | ORAL_CAPSULE | ORAL | 0 refills | Status: DC

## 2017-11-29 MED FILL — POTASSIUM CL ER 10 MEQ TAB: 10 | 30 days supply | Qty: 30 | Fill #0

## 2017-11-29 MED FILL — VIT D2 1.25 MG (50,000 UNIT: 1.25 MG | 28 days supply | Qty: 4 | Fill #0

## 2017-11-29 NOTE — Telephone Encounter (Signed)
-----   Message from Anders SimmondsAngela M McClung, [place]PA-C sent at 11/29/2017  2:06 PM EDT ----- Please call patient and let them that their vitamin D is low.  This can contribute to muscle aches, anxiety, fatigue, and depression.  I have sent a prescription to the pharmacy for them to take once a week.  We will recheck this level in 3-4 months.  Her potassium is also low.  I sent her a prescription to take daily.  She should make an appt in a few weeks to recheck her potassium.   Thanks, Georgian CoAngela McClung, PA-C

## 2017-11-29 NOTE — Telephone Encounter (Signed)
Patient verified DOB Patient is aware of potassium and vitamin d level being low. Patient is advised to pick up prescriptions on Monday and to return for a potassium recheck in a few weeks.

## 2017-12-20 ENCOUNTER — Encounter: Payer: Self-pay | Admitting: Nurse Practitioner

## 2017-12-20 ENCOUNTER — Ambulatory Visit: Payer: Medicare Other | Attending: Nurse Practitioner | Admitting: Nurse Practitioner

## 2017-12-20 VITALS — BP 118/73 | HR 87 | Temp 98.6°F | Ht 64.0 in | Wt 192.8 lb

## 2017-12-20 DIAGNOSIS — E559 Vitamin D deficiency, unspecified: Secondary | ICD-10-CM | POA: Insufficient documentation

## 2017-12-20 DIAGNOSIS — Z1211 Encounter for screening for malignant neoplasm of colon: Secondary | ICD-10-CM

## 2017-12-20 DIAGNOSIS — Z9889 Other specified postprocedural states: Secondary | ICD-10-CM | POA: Insufficient documentation

## 2017-12-20 DIAGNOSIS — F17210 Nicotine dependence, cigarettes, uncomplicated: Secondary | ICD-10-CM | POA: Diagnosis not present

## 2017-12-20 DIAGNOSIS — E782 Mixed hyperlipidemia: Secondary | ICD-10-CM | POA: Diagnosis not present

## 2017-12-20 DIAGNOSIS — Z809 Family history of malignant neoplasm, unspecified: Secondary | ICD-10-CM | POA: Diagnosis not present

## 2017-12-20 DIAGNOSIS — I10 Essential (primary) hypertension: Secondary | ICD-10-CM | POA: Insufficient documentation

## 2017-12-20 DIAGNOSIS — Z8249 Family history of ischemic heart disease and other diseases of the circulatory system: Secondary | ICD-10-CM | POA: Insufficient documentation

## 2017-12-20 DIAGNOSIS — N958 Other specified menopausal and perimenopausal disorders: Secondary | ICD-10-CM | POA: Diagnosis not present

## 2017-12-20 DIAGNOSIS — Z79899 Other long term (current) drug therapy: Secondary | ICD-10-CM | POA: Diagnosis not present

## 2017-12-20 DIAGNOSIS — Z780 Asymptomatic menopausal state: Secondary | ICD-10-CM | POA: Diagnosis not present

## 2017-12-20 DIAGNOSIS — Z7982 Long term (current) use of aspirin: Secondary | ICD-10-CM | POA: Diagnosis not present

## 2017-12-20 DIAGNOSIS — K219 Gastro-esophageal reflux disease without esophagitis: Secondary | ICD-10-CM | POA: Diagnosis not present

## 2017-12-20 MED ORDER — VITAMIN D (ERGOCALCIFEROL) 1.25 MG (50000 UNIT) PO CAPS: 50000.0000 [IU] | ORAL_CAPSULE | ORAL | 1 refills | Status: DC

## 2017-12-20 MED ORDER — AMLODIPINE BESYLATE 5 MG PO TABS: 5.0000 mg | ORAL_TABLET | Freq: Every day | ORAL | 1 refills | Status: DC

## 2017-12-20 MED ORDER — HYDROCHLOROTHIAZIDE 25 MG PO TABS
25.0000 mg | ORAL_TABLET | Freq: Every day | ORAL | 1 refills | Status: DC
Start: 2017-12-20 — End: 2018-09-18

## 2017-12-20 MED ORDER — LOVASTATIN 20 MG PO TABS: 20.0000 mg | ORAL_TABLET | Freq: Every day | ORAL | 1 refills | Status: DC

## 2017-12-20 MED FILL — VIT D2 1.25 MG (50,000 UNIT: 1.25 MG | 28 days supply | Qty: 4 | Fill #1

## 2017-12-20 MED FILL — LOVASTATIN 20 MG TABLET: 20 | 30 days supply | Qty: 30 | Fill #2

## 2017-12-20 NOTE — Progress Notes (Signed)
Assessment & Plan:  Rebecca Pope was seen today for establish care.  Diagnoses and all orders for this visit:  Essential hypertension -     amLODipine (NORVASC) 5 MG tablet; Take 1 tablet (5 mg total) by mouth daily. -     hydrochlorothiazide (HYDRODIURIL) 25 MG tablet; Take 1 tablet (25 mg total) by mouth daily. -     Basic metabolic panel; Future Continue all antihypertensives as prescribed.  Remember to bring in your blood pressure log with you for your follow up appointment.  DASH/Mediterranean Diets are healthier choices for HTN.   Mixed hyperlipidemia -     lovastatin (MEVACOR) 20 MG tablet; Take 1 tablet (20 mg total) by mouth at bedtime. INSTRUCTIONS: Work on a low fat, heart healthy diet and participate in regular aerobic exercise program by working out at least 150 minutes per week. No fried foods. No junk foods, sodas, sugary drinks, unhealthy snacking, alcohol or smoking.     Postmenopausal estrogen deficiency -     DG Bone Density; Future  Colon cancer screening -     Fecal occult blood, imunochemical  Tobacco dependence due to cigarettes Rebecca Pope was counseled on the dangers of tobacco use, and was advised to quit. Reviewed strategies to maximize success, including removing cigarettes and smoking materials from environment, stress management and support of family/friends as well as pharmacological alternatives including: Wellbutrin, Chantix, Nicotine patch, Nicotine gum or lozenges. Smoking cessation support: smoking cessation hotline: 1-800-QUIT-NOW.  Smoking cessation classes are also available through [facility] and [facility]. Call [phone] or visit our website at [email or web address].   A total of 5 minutes was spent on counseling for smoking cessation and Rebecca Pope is not ready to quit.   Vitamin D deficiency -     Vitamin D, Ergocalciferol, (DRISDOL) 50000 units CAPS capsule; Take 1 capsule (50,000 Units total) by mouth every 7 (seven) days.    Patient  has been counseled on age-appropriate routine health concerns for screening and prevention. These are reviewed and up-to-date. Referrals have been placed accordingly. Immunizations are up-to-date or declined.    Subjective:   Chief Complaint  Patient presents with  . Establish Care    Pt. is here to establish care for hypertension.    HPI Rebecca Pope 67 y.o. female presents to office today to establish care. She has a history of HTN, GERD (well controlled on omeprazole 20mg  daily), vitamin D deficiency,  and HPL.   CHRONIC HYPERTENSION Disease Monitoring  Blood pressure range: She does not check her blood pressure at home  Chest pain: no   Dyspnea: no   Claudication: no  Medication compliance: yes; taking amlodipine 5mg  and HCTZ 25mg  daily Medication Side Effects  Lightheadedness: no   Urinary frequency: no   Edema: no   Impotence: no  Preventitive Healthcare:  Exercise: no   Diet Pattern: salt not added to cooking  Salt Restriction:  yes  BP Readings from Last 3 Encounters:  12/20/17 118/73  11/28/17 119/80  04/05/17 (!) 138/92   Hyperlipidemia Patient presents for follow up to hyperlipidemia.  She is medication compliant. She is diet compliant and denies skin xanthelasma or statin intolerance including myalgias. LDL not at goal; fasting lipid panel pending.  Lab Results  Component Value Date   CHOL 226 (H) 07/06/2016   Lab Results  Component Value Date   HDL 55 07/06/2016   Lab Results  Component Value Date   LDLCALC 149 (H) 07/06/2016   Lab Results  Component Value  Date   TRIG 108 07/06/2016   Lab Results  Component Value Date   CHOLHDL 4.1 07/06/2016   Review of Systems  Constitutional: Negative for fever, malaise/fatigue and weight loss.  HENT: Negative.  Negative for nosebleeds.   Eyes: Negative.  Negative for blurred vision, double vision and photophobia.  Respiratory: Negative.  Negative for cough and shortness of breath.   Cardiovascular:  Negative.  Negative for chest pain, palpitations and leg swelling.  Gastrointestinal: Negative.  Negative for heartburn, nausea and vomiting.  Musculoskeletal: Negative.  Negative for myalgias.  Neurological: Negative.  Negative for dizziness, focal weakness, seizures and headaches.  Psychiatric/Behavioral: Negative.  Negative for suicidal ideas.    Past Medical History:  Diagnosis Date  . Hypertension     Past Surgical History:  Procedure Laterality Date  . APPENDECTOMY      Family History  Problem Relation Age of Onset  . Cancer Mother   . Hypertension Mother   . Cancer Father     Social History Reviewed with no changes to be made today.   Outpatient Medications Prior to Visit  Medication Sig Dispense Refill  . omeprazole (PRILOSEC) 20 MG capsule Take 1 capsule (20 mg total) by mouth daily. 30 capsule 3  . potassium chloride (K-DUR) 10 MEQ tablet Take 1 tablet (10 mEq total) by mouth daily. 30 tablet 2  . amLODipine (NORVASC) 5 MG tablet Take 1 tablet (5 mg total) by mouth daily. 30 tablet 3  . hydrochlorothiazide (HYDRODIURIL) 25 MG tablet Take 1 tablet (25 mg total) by mouth daily. 30 tablet 3  . lovastatin (MEVACOR) 20 MG tablet Take 1 tablet (20 mg total) by mouth at bedtime. 30 tablet 3  . Vitamin D, Ergocalciferol, (DRISDOL) 50000 units CAPS capsule Take 1 capsule (50,000 Units total) by mouth every 7 (seven) days. 16 capsule 0  . aspirin EC 81 MG tablet Take 1 tablet (81 mg total) by mouth daily. (Patient not taking: Reported on 12/20/2017) 30 tablet 3  . nicotine polacrilex (EQ NICOTINE POLACRILEX) 4 MG gum Take 1 each (4 mg total) by mouth as needed for smoking cessation. (Patient not taking: Reported on 04/05/2017) 100 tablet 0   No facility-administered medications prior to visit.     No Known Allergies     Objective:    BP 118/73 (BP Location: Left Arm, Patient Position: Sitting, Cuff Size: Normal)   Pulse 87   Temp 98.6 F (37 C) (Oral)   Ht 5\' 4"  (1.626  m)   Wt 192 lb 12.8 oz (87.5 kg)   SpO2 99%   BMI 33.09 kg/m  Wt Readings from Last 3 Encounters:  12/20/17 192 lb 12.8 oz (87.5 kg)  11/28/17 195 lb (88.5 kg)  04/05/17 200 lb (90.7 kg)    Physical Exam  Constitutional: She is oriented to person, place, and time. She appears well-developed and well-nourished. She is cooperative.  HENT:  Head: Normocephalic and atraumatic.  Eyes: EOM are normal.  Neck: Normal range of motion.  Cardiovascular: Normal rate, regular rhythm and normal heart sounds. Exam reveals no gallop and no friction rub.  No murmur heard. Pulmonary/Chest: Effort normal and breath sounds normal. No tachypnea. No respiratory distress. She has no decreased breath sounds. She has no wheezes. She has no rhonchi. She has no rales. She exhibits no tenderness.  Abdominal: Bowel sounds are normal.  Musculoskeletal: Normal range of motion. She exhibits no edema.  Neurological: She is alert and oriented to person, place, and time. Coordination normal.  Skin: Skin is warm and dry.  Psychiatric: She has a normal mood and affect. Her behavior is normal. Judgment and thought content normal.  Nursing note and vitals reviewed.      Patient has been counseled extensively about nutrition and exercise as well as the importance of adherence with medications and regular follow-up. The patient was given clear instructions to go to ER or return to medical center if symptoms don't improve, worsen or new problems develop. The patient verbalized understanding.   Follow-up: Return in about 1 week (around 12/27/2017) for  NEEDS BMP next week and see below.   Claiborne RiggZelda W Fleming, FNP-BC [facility] [place], [place] [phone]   12/20/2017, 2:56 PM

## 2017-12-20 NOTE — Patient Instructions (Addendum)
Don't forget to call on July 3rd for your refills of amlodipine and Hydrochlorothiazide   You will have your potassium level rechecked in the lab on July 5th the same day you will pick up your refills.     Health Maintenance for Postmenopausal Women Menopause is a normal process in which your reproductive ability comes to an end. This process happens gradually over a span of months to years, usually between the ages of 33 and 41. Menopause is complete when you have missed 12 consecutive menstrual periods. It is important to talk with your health care provider about some of the most common conditions that affect postmenopausal women, such as heart disease, cancer, and bone loss (osteoporosis). Adopting a healthy lifestyle and getting preventive care can help to promote your health and wellness. Those actions can also lower your chances of developing some of these common conditions. What should I know about menopause? During menopause, you may experience a number of symptoms, such as:  Moderate-to-severe hot flashes.  Night sweats.  Decrease in sex drive.  Mood swings.  Headaches.  Tiredness.  Irritability.  Memory problems.  Insomnia.  Choosing to treat or not to treat menopausal changes is an individual decision that you make with your health care provider. What should I know about hormone replacement therapy and supplements? Hormone therapy products are effective for treating symptoms that are associated with menopause, such as hot flashes and night sweats. Hormone replacement carries certain risks, especially as you become older. If you are thinking about using estrogen or estrogen with progestin treatments, discuss the benefits and risks with your health care provider. What should I know about heart disease and stroke? Heart disease, heart attack, and stroke become more likely as you age. This may be due, in part, to the hormonal changes that your body experiences during  menopause. These can affect how your body processes dietary fats, triglycerides, and cholesterol. Heart attack and stroke are both medical emergencies. There are many things that you can do to help prevent heart disease and stroke:  Have your blood pressure checked at least every 1-2 years. High blood pressure causes heart disease and increases the risk of stroke.  If you are 71-63 years old, ask your health care provider if you should take aspirin to prevent a heart attack or a stroke.  Do not use any tobacco products, including cigarettes, chewing tobacco, or electronic cigarettes. If you need help quitting, ask your health care provider.  It is important to eat a healthy diet and maintain a healthy weight. ? Be sure to include plenty of vegetables, fruits, low-fat dairy products, and lean protein. ? Avoid eating foods that are high in solid fats, added sugars, or salt (sodium).  Get regular exercise. This is one of the most important things that you can do for your health. ? Try to exercise for at least 150 minutes each week. The type of exercise that you do should increase your heart rate and make you sweat. This is known as moderate-intensity exercise. ? Try to do strengthening exercises at least twice each week. Do these in addition to the moderate-intensity exercise.  Know your numbers.Ask your health care provider to check your cholesterol and your blood glucose. Continue to have your blood tested as directed by your health care provider.  What should I know about cancer screening? There are several types of cancer. Take the following steps to reduce your risk and to catch any cancer development as early as possible. Breast  Cancer  Practice breast self-awareness. ? This means understanding how your breasts normally appear and feel. ? It also means doing regular breast self-exams. Let your health care provider know about any changes, no matter how small.  If you are 40 or older,  have a clinician do a breast exam (clinical breast exam or CBE) every year. Depending on your age, family history, and medical history, it may be recommended that you also have a yearly breast X-ray (mammogram).  If you have a family history of breast cancer, talk with your health care provider about genetic screening.  If you are at high risk for breast cancer, talk with your health care provider about having an MRI and a mammogram every year.  Breast cancer (BRCA) gene test is recommended for women who have family members with BRCA-related cancers. Results of the assessment will determine the need for genetic counseling and BRCA1 and for BRCA2 testing. BRCA-related cancers include these types: ? Breast. This occurs in males or females. ? Ovarian. ? Tubal. This may also be called fallopian tube cancer. ? Cancer of the abdominal or pelvic lining (peritoneal cancer). ? Prostate. ? Pancreatic.  Cervical, Uterine, and Ovarian Cancer Your health care provider may recommend that you be screened regularly for cancer of the pelvic organs. These include your ovaries, uterus, and vagina. This screening involves a pelvic exam, which includes checking for microscopic changes to the surface of your cervix (Pap test).  For women ages 21-65, health care providers may recommend a pelvic exam and a Pap test every three years. For women ages 62-65, they may recommend the Pap test and pelvic exam, combined with testing for human papilloma virus (HPV), every five years. Some types of HPV increase your risk of cervical cancer. Testing for HPV may also be done on women of any age who have unclear Pap test results.  Other health care providers may not recommend any screening for nonpregnant women who are considered low risk for pelvic cancer and have no symptoms. Ask your health care provider if a screening pelvic exam is right for you.  If you have had past treatment for cervical cancer or a condition that could  lead to cancer, you need Pap tests and screening for cancer for at least 20 years after your treatment. If Pap tests have been discontinued for you, your risk factors (such as having a new sexual partner) need to be reassessed to determine if you should start having screenings again. Some women have medical problems that increase the chance of getting cervical cancer. In these cases, your health care provider may recommend that you have screening and Pap tests more often.  If you have a family history of uterine cancer or ovarian cancer, talk with your health care provider about genetic screening.  If you have vaginal bleeding after reaching menopause, tell your health care provider.  There are currently no reliable tests available to screen for ovarian cancer.  Lung Cancer Lung cancer screening is recommended for adults 87-54 years old who are at high risk for lung cancer because of a history of smoking. A yearly low-dose CT scan of the lungs is recommended if you:  Currently smoke.  Have a history of at least 30 pack-years of smoking and you currently smoke or have quit within the past 15 years. A pack-year is smoking an average of one pack of cigarettes per day for one year.  Yearly screening should:  Continue until it has been 15 years since you  quit.  Stop if you develop a health problem that would prevent you from having lung cancer treatment.  Colorectal Cancer  This type of cancer can be detected and can often be prevented.  Routine colorectal cancer screening usually begins at age 41 and continues through age 36.  If you have risk factors for colon cancer, your health care provider may recommend that you be screened at an earlier age.  If you have a family history of colorectal cancer, talk with your health care provider about genetic screening.  Your health care provider may also recommend using home test kits to check for hidden blood in your stool.  A small camera at the  end of a tube can be used to examine your colon directly (sigmoidoscopy or colonoscopy). This is done to check for the earliest forms of colorectal cancer.  Direct examination of the colon should be repeated every 5-10 years until age 26. However, if early forms of precancerous polyps or small growths are found or if you have a family history or genetic risk for colorectal cancer, you may need to be screened more often.  Skin Cancer  Check your skin from head to toe regularly.  Monitor any moles. Be sure to tell your health care provider: ? About any new moles or changes in moles, especially if there is a change in a mole's shape or color. ? If you have a mole that is larger than the size of a pencil eraser.  If any of your family members has a history of skin cancer, especially at a young age, talk with your health care provider about genetic screening.  Always use sunscreen. Apply sunscreen liberally and repeatedly throughout the day.  Whenever you are outside, protect yourself by wearing long sleeves, pants, a wide-brimmed hat, and sunglasses.  What should I know about osteoporosis? Osteoporosis is a condition in which bone destruction happens more quickly than new bone creation. After menopause, you may be at an increased risk for osteoporosis. To help prevent osteoporosis or the bone fractures that can happen because of osteoporosis, the following is recommended:  If you are 38-49 years old, get at least 1,000 mg of calcium and at least 600 mg of vitamin D per day.  If you are older than age 39 but younger than age 15, get at least 1,200 mg of calcium and at least 600 mg of vitamin D per day.  If you are older than age 39, get at least 1,200 mg of calcium and at least 800 mg of vitamin D per day.  Smoking and excessive alcohol intake increase the risk of osteoporosis. Eat foods that are rich in calcium and vitamin D, and do weight-bearing exercises several times each week as directed  by your health care provider. What should I know about how menopause affects my mental health? Depression may occur at any age, but it is more common as you become older. Common symptoms of depression include:  Low or sad mood.  Changes in sleep patterns.  Changes in appetite or eating patterns.  Feeling an overall lack of motivation or enjoyment of activities that you previously enjoyed.  Frequent crying spells.  Talk with your health care provider if you think that you are experiencing depression. What should I know about immunizations? It is important that you get and maintain your immunizations. These include:  Tetanus, diphtheria, and pertussis (Tdap) booster vaccine.  Influenza every year before the flu season begins.  Pneumonia vaccine.  Shingles vaccine.  Your health care provider may also recommend other immunizations. This information is not intended to replace advice given to you by your health care provider. Make sure you discuss any questions you have with your health care provider. Document Released: 08/03/2005 Document Revised: 12/30/2015 Document Reviewed: 03/15/2015 Elsevier Interactive Patient Education  2018 [employer].

## 2017-12-23 MED FILL — POTASSIUM CL 10 MEQ TAB SA: 10 | 30 days supply | Qty: 30 | Fill #1

## 2017-12-23 MED FILL — HYDROCHLOROTHIAZIDE 25 MG T: 25 | 30 days supply | Qty: 30 | Fill #1

## 2017-12-23 MED FILL — AMLODIPINE BESYLATE 5 MG TA: 5 | 30 days supply | Qty: 30 | Fill #1

## 2017-12-27 ENCOUNTER — Ambulatory Visit: Payer: Medicare Other | Attending: Family Medicine

## 2017-12-27 DIAGNOSIS — E782 Mixed hyperlipidemia: Secondary | ICD-10-CM

## 2017-12-27 DIAGNOSIS — E785 Hyperlipidemia, unspecified: Secondary | ICD-10-CM | POA: Insufficient documentation

## 2017-12-27 DIAGNOSIS — I10 Essential (primary) hypertension: Secondary | ICD-10-CM | POA: Diagnosis present

## 2017-12-27 NOTE — Progress Notes (Signed)
Patient here for lab visit only 

## 2017-12-28 LAB — LIPID PANEL
CHOLESTEROL TOTAL: 255 mg/dL — AB (ref 100–199)
Chol/HDL Ratio: 5.5 ratio — ABNORMAL HIGH (ref 0.0–4.4)
HDL: 46 mg/dL (ref >39)
LDL CALC: 186 mg/dL — AB (ref 0–99)
TRIGLYCERIDES: 117 mg/dL (ref 0–149)
VLDL Cholesterol Cal: 23 mg/dL (ref 5–40)

## 2017-12-28 LAB — BASIC METABOLIC PANEL
BUN/Creatinine Ratio: 22 (ref 12–28)
BUN: 21 mg/dL (ref 8–27)
CALCIUM: 9.5 mg/dL (ref 8.7–10.3)
CO2: 27 mmol/L (ref 20–29)
Chloride: 99 mmol/L (ref 96–106)
Creatinine, Ser: 0.97 mg/dL (ref 0.57–1.00)
GFR calc non Af Amer: 60 mL/min/{1.73_m2} (ref >59)
GFR, EST AFRICAN AMERICAN: 69 mL/min/{1.73_m2} (ref >59)
Glucose: 79 mg/dL (ref 65–99)
Potassium: 3.6 mmol/L (ref 3.5–5.2)
SODIUM: 144 mmol/L (ref 134–144)

## 2018-01-03 ENCOUNTER — Telehealth: Payer: Self-pay

## 2018-01-03 NOTE — Telephone Encounter (Signed)
CMA attempt to call patient to inform on lab results. No answer and left a VM for patient to call back.  If patient call back, please inform:  Cholesterol levels are elevated. Continue the lovastatin I prescribed as instructed. INSTRUCTIONS: Work on a low fat, heart healthy diet and participate in regular aerobic exercise program by working out at least 150 minutes per week. No red meat, fried foods. No junk foods, sodas, sugary drinks, unhealthy snacking, alcohol or smoking.  Potassium is normal. Continue potassium as prescribed. Kidney function is stable.

## 2018-01-03 NOTE — Telephone Encounter (Signed)
-----   Message from Claiborne RiggZelda W Fleming, NP sent at 12/30/2017  9:11 PM EDT ----- Cholesterol levels are elevated. Continue the lovastatin I prescribed as instructed. INSTRUCTIONS: Work on a low fat, heart healthy diet and participate in regular aerobic exercise program by working out at least 150 minutes per week. No red meat, fried foods. No junk foods, sodas, sugary drinks, unhealthy snacking, alcohol or smoking.  Potassium is normal. Continue potassium as prescribed. Kidney function is stable.

## 2018-01-16 MED FILL — LOVASTATIN 20 MG TABLET: 20 | 30 days supply | Qty: 30 | Fill #3

## 2018-01-30 MED FILL — VIT D2 1.25 MG (50,000 UNIT: 1.25 MG | 28 days supply | Qty: 4 | Fill #2

## 2018-03-25 ENCOUNTER — Other Ambulatory Visit: Payer: Self-pay | Admitting: Nurse Practitioner

## 2018-03-25 DIAGNOSIS — Z1231 Encounter for screening mammogram for malignant neoplasm of breast: Secondary | ICD-10-CM

## 2018-03-25 DIAGNOSIS — E2839 Other primary ovarian failure: Secondary | ICD-10-CM

## 2018-04-07 MED FILL — AMLODIPINE BESYLATE 5 MG TA: 5 | 30 days supply | Qty: 30 | Fill #2

## 2018-04-07 MED FILL — HYDROCHLOROTHIAZIDE 25 MG T: 25 | 30 days supply | Qty: 30 | Fill #2

## 2018-05-29 ENCOUNTER — Ambulatory Visit
Admission: RE | Admit: 2018-05-29 | Discharge: 2018-05-29 | Disposition: A | Discharge status: Home / Self Care | Payer: Medicare Other | Source: Ambulatory Visit | Attending: Nurse Practitioner | Admitting: Nurse Practitioner

## 2018-05-29 DIAGNOSIS — E2839 Other primary ovarian failure: Secondary | ICD-10-CM

## 2018-05-29 DIAGNOSIS — Z1231 Encounter for screening mammogram for malignant neoplasm of breast: Secondary | ICD-10-CM

## 2018-06-06 ENCOUNTER — Telehealth (INDEPENDENT_AMBULATORY_CARE_PROVIDER_SITE_OTHER): Payer: Self-pay

## 2018-06-06 NOTE — Telephone Encounter (Signed)
-----   Message from Claiborne RiggZelda W Fleming, NP sent at 06/02/2018 10:05 PM EST ----- Bone density test shows osteopenia. I would like for you take a calcium and vitamin D 3 supplement. The calcium should be at least 1200 mg and the D3 should be at least 1000 units daily.

## 2018-06-06 NOTE — Telephone Encounter (Signed)
Left message asking patient to return call to [facility] at [phone]. Virdia Ziesmer S Shakai Dolley, CMA  

## 2018-09-18 ENCOUNTER — Other Ambulatory Visit: Payer: Self-pay | Admitting: Nurse Practitioner

## 2018-09-18 DIAGNOSIS — I10 Essential (primary) hypertension: Secondary | ICD-10-CM

## 2018-09-18 NOTE — Telephone Encounter (Signed)
1) Medication(s) Requested (by name): bp med (states they dont know the name) 2) Pharmacy of Choice:  [facility] on [address]

## 2018-09-19 ENCOUNTER — Other Ambulatory Visit: Payer: Self-pay | Admitting: Physician Assistant

## 2018-09-19 DIAGNOSIS — I10 Essential (primary) hypertension: Secondary | ICD-10-CM

## 2018-09-22 MED ORDER — AMLODIPINE BESYLATE 5 MG PO TABS: 5.0000 mg | ORAL_TABLET | Freq: Every day | ORAL | 0 refills | Status: DC

## 2018-09-22 MED ORDER — HYDROCHLOROTHIAZIDE 25 MG PO TABS: 25.0000 mg | ORAL_TABLET | Freq: Every day | ORAL | 0 refills | Status: DC

## 2018-12-20 ENCOUNTER — Other Ambulatory Visit: Payer: Self-pay | Admitting: Nurse Practitioner

## 2018-12-20 DIAGNOSIS — I10 Essential (primary) hypertension: Secondary | ICD-10-CM

## 2019-01-15 ENCOUNTER — Other Ambulatory Visit: Payer: Self-pay | Admitting: Nurse Practitioner

## 2019-01-15 DIAGNOSIS — I10 Essential (primary) hypertension: Secondary | ICD-10-CM

## 2019-01-15 NOTE — Telephone Encounter (Signed)
1) Medication(s) Requested (by name): AMLODIPINE & HYDROCHLOROTHIAZIDE  2) Pharmacy of Choice: [facility]  3) Special Requests: Pt has appt 02/06/19   Approved medications will be sent to the pharmacy, we will reach out if there is an issue.  Requests made after 3pm may not be addressed until the following business day!  If a patient is unsure of the name of the medication(s) please note and ask patient to call back when they are able to provide all info, do not send to responsible party until all information is available!

## 2019-01-18 MED ORDER — AMLODIPINE BESYLATE 5 MG PO TABS: 5.0000 mg | ORAL_TABLET | Freq: Every day | ORAL | 0 refills | Status: DC

## 2019-01-18 MED ORDER — HYDROCHLOROTHIAZIDE 25 MG PO TABS: 25.0000 mg | ORAL_TABLET | Freq: Every day | ORAL | 0 refills | Status: DC

## 2019-02-06 ENCOUNTER — Ambulatory Visit: Payer: Medicare Other | Admitting: Nurse Practitioner

## 2019-02-14 ENCOUNTER — Other Ambulatory Visit: Payer: Self-pay | Admitting: Nurse Practitioner

## 2019-02-14 DIAGNOSIS — I10 Essential (primary) hypertension: Secondary | ICD-10-CM

## 2019-02-20 ENCOUNTER — Ambulatory Visit: Payer: Medicare Other | Attending: Nurse Practitioner | Admitting: Nurse Practitioner

## 2019-02-20 ENCOUNTER — Encounter: Payer: Self-pay | Admitting: Nurse Practitioner

## 2019-02-20 ENCOUNTER — Other Ambulatory Visit: Payer: Self-pay

## 2019-02-20 VITALS — BP 102/77 | HR 71 | Temp 98.9°F | Ht 64.0 in | Wt 191.0 lb

## 2019-02-20 DIAGNOSIS — F17210 Nicotine dependence, cigarettes, uncomplicated: Secondary | ICD-10-CM

## 2019-02-20 DIAGNOSIS — R7303 Prediabetes: Secondary | ICD-10-CM

## 2019-02-20 DIAGNOSIS — I10 Essential (primary) hypertension: Secondary | ICD-10-CM

## 2019-02-20 DIAGNOSIS — K219 Gastro-esophageal reflux disease without esophagitis: Secondary | ICD-10-CM

## 2019-02-20 DIAGNOSIS — E782 Mixed hyperlipidemia: Secondary | ICD-10-CM

## 2019-02-20 DIAGNOSIS — Z1211 Encounter for screening for malignant neoplasm of colon: Secondary | ICD-10-CM

## 2019-02-20 DIAGNOSIS — E559 Vitamin D deficiency, unspecified: Secondary | ICD-10-CM

## 2019-02-20 LAB — POCT GLYCOSYLATED HEMOGLOBIN (HGB A1C): Hemoglobin A1C: 6.1 % — AB (ref 4.0–5.6)

## 2019-02-20 LAB — GLUCOSE, POCT (MANUAL RESULT ENTRY): POC Glucose: 112 mg/dl — AB (ref 70–99)

## 2019-02-20 MED ORDER — HYDROCHLOROTHIAZIDE 25 MG PO TABS: 25.0000 mg | ORAL_TABLET | Freq: Every day | ORAL | 1 refills | Status: DC

## 2019-02-20 MED ORDER — ASPIRIN EC 81 MG PO TBEC: 81.0000 mg | DELAYED_RELEASE_TABLET | Freq: Every day | ORAL | 3 refills | Status: DC

## 2019-02-20 MED ORDER — POTASSIUM CHLORIDE ER 10 MEQ PO TBCR: 10.0000 meq | EXTENDED_RELEASE_TABLET | Freq: Every day | ORAL | 2 refills | Status: DC

## 2019-02-20 MED ORDER — LOVASTATIN 20 MG PO TABS: 20.0000 mg | ORAL_TABLET | Freq: Every day | ORAL | 1 refills | Status: DC

## 2019-02-20 MED ORDER — AMLODIPINE BESYLATE 5 MG PO TABS: 5.0000 mg | ORAL_TABLET | Freq: Every day | ORAL | 1 refills | Status: DC

## 2019-02-20 MED ORDER — OMEPRAZOLE 20 MG PO CPDR: 20.0000 mg | DELAYED_RELEASE_CAPSULE | Freq: Every day | ORAL | 1 refills | Status: DC

## 2019-02-20 NOTE — Patient Instructions (Signed)
You should be taking 800- 1000 mg of Vitamin D and 1200 mg of calcium daily.   Osteopenia  Osteopenia is a loss of thickness (density) inside of the bones. Another name for osteopenia is low bone mass. Mild osteopenia is a normal part of aging. It is not a disease, and it does not cause symptoms. However, if you have osteopenia and continue to lose bone mass, you could develop a condition that causes the bones to become thin and break more easily (osteoporosis). You may also lose some height, have back pain, and have a stooped posture. Although osteopenia is not a disease, making changes to your lifestyle and diet can help to prevent osteopenia from developing into osteoporosis. What are the causes? Osteopenia is caused by loss of calcium in the bones.  Bones are constantly changing. Old bone cells are continually being replaced with new bone cells. This process builds new bone. The mineral calcium is needed to build new bone and maintain bone density. Bone density is usually highest around age 69. After that, most people's bodies cannot replace all the bone they have lost with new bone. What increases the risk? You are more likely to develop this condition if:  You are older than age 69.  You are a woman who went through menopause early.  You have a long illness that keeps you in bed.  You do not get enough exercise.  You lack certain nutrients (malnutrition).  You have an overactive thyroid gland (hyperthyroidism).  You smoke.  You drink a lot of alcohol.  You are taking medicines that weaken the bones, such as steroids. What are the signs or symptoms? This condition does not cause any symptoms. You may have a slightly higher risk for bone breaks (fractures), so getting fractures more easily than normal may be an indication of osteopenia. How is this diagnosed? Your health care provider can diagnose this condition with a special type of X-ray exam that measures bone density  (dual-energy X-ray absorptiometry, DEXA). This test can measure bone density in your hips, spine, and wrists. Osteopenia has no symptoms, so this condition is usually diagnosed after a routine bone density screening test is done for osteoporosis. This routine screening is usually done for:  Women who are age 69 or older.  Men who are age 270 or older. If you have risk factors for osteopenia, you may have the screening test at an earlier age. How is this treated? Making dietary and lifestyle changes can lower your risk for osteoporosis. If you have severe osteopenia that is close to becoming osteoporosis, your health care provider may prescribe medicines and dietary supplements such as calcium and vitamin D. These supplements help to rebuild bone density. Follow these instructions at home:   Take over-the-counter and prescription medicines only as told by your health care provider. These include vitamins and supplements.  Eat a diet that is high in calcium and vitamin D. ? Calcium is found in dairy products, beans, salmon, and leafy green vegetables like spinach and broccoli. ? Look for foods that have vitamin D and calcium added to them (fortified foods), such as orange juice, cereal, and bread.  Do 30 or more minutes of a weight-bearing exercise every day, such as walking, jogging, or playing a sport. These types of exercises strengthen the bones.  Take precautions at home to lower your risk of falling, such as: ? Keeping rooms well-lit and free of clutter, such as cords. ? Installing safety rails on stairs. ? Using  rubber mats in the bathroom or other areas that are often wet or slippery.  Do not use any products that contain nicotine or tobacco, such as cigarettes and e-cigarettes. If you need help quitting, ask your health care provider.  Avoid alcohol or limit alcohol intake to no more than 1 drink a day for nonpregnant women and 2 drinks a day for men. One drink equals 12 oz of beer,  5 oz of wine, or 1 oz of hard liquor.  Keep all follow-up visits as told by your health care provider. This is important. Contact a health care provider if:  You have not had a bone density screening for osteoporosis and you are: ? A woman, age 36 or older. ? A man, age 38 or older.  You are a postmenopausal woman who has not had a bone density screening for osteoporosis.  You are older than age 36 and you want to know if you should have bone density screening for osteoporosis. Summary  Osteopenia is a loss of thickness (density) inside of the bones. Another name for osteopenia is low bone mass.  Osteopenia is not a disease, but it may increase your risk for a condition that causes the bones to become thin and break more easily (osteoporosis).  You may be at risk for osteopenia if you are older than age 16 or if you are a woman who went through early menopause.  Osteopenia does not cause any symptoms, but it can be diagnosed with a bone density screening test.  Dietary and lifestyle changes are the first treatment for osteopenia. These may lower your risk for osteoporosis. This information is not intended to replace advice given to you by your health care provider. Make sure you discuss any questions you have with your health care provider. Document Released: 03/20/2017 Document Revised: 05/24/2017 Document Reviewed: 03/20/2017 Elsevier Patient Education  2020 [employer].

## 2019-02-20 NOTE — Progress Notes (Signed)
Assessment & Plan:  Diagnoses and all orders for this visit:  Essential hypertension -     hydrochlorothiazide (HYDRODIURIL) 25 MG tablet; Take 1 tablet (25 mg total) by mouth daily. -     amLODipine (NORVASC) 5 MG tablet; Take 1 tablet (5 mg total) by mouth daily. -     potassium chloride (K-DUR) 10 MEQ tablet; Take 1 tablet (10 mEq total) by mouth daily.  Continue all antihypertensives as prescribed.  Remember to bring in your blood pressure log with you for your follow up appointment.  DASH/Mediterranean Diets are healthier choices for HTN.    Mixed hyperlipidemia -     Lipid panel -     aspirin EC 81 MG tablet; Take 1 tablet (81 mg total) by mouth daily. -     lovastatin (MEVACOR) 20 MG tablet; Take 1 tablet (20 mg total) by mouth at bedtime. INSTRUCTIONS: Work on a low fat, heart healthy diet and participate in regular aerobic exercise program by working out at least 150 minutes per week; 5 days a week-30 minutes per day. Avoid red meat, fried foods. junk foods, sodas, sugary drinks, unhealthy snacking, alcohol and smoking.  Drink at least 48oz of water per day and monitor your carbohydrate intake daily.    Prediabetes -     Glucose (CBG) -     HgB A1c -     CMP14+EGFR Continue blood sugar control as discussed in office today, low carbohydrate diet, and regular physical exercise as tolerated, 150 minutes per week (30 min each day, 5 days per week, or 50 min 3 days per week). Keep blood sugar logs with fasting goal of 90-130 mg/dl, post prandial (after you eat) less than 180.  For Hypoglycemia: BS <60 and Hyperglycemia BS >400; contact the clinic ASAP. Annual eye exams and foot exams are recommended.   Gastroesophageal reflux disease, esophagitis presence not specified -     CBC -     omeprazole (PRILOSEC) 20 MG capsule; Take 1 capsule (20 mg total) by mouth daily. INSTRUCTIONS: Avoid GERD Triggers: acidic, spicy or fried foods, caffeine, coffee, sodas,  alcohol and chocolate.    Colon cancer screening -     Fecal occult blood, imunochemical; Future -     Fecal occult blood, imunochemical  Tobacco dependence due to cigarettes She has cut back from smoking 1 ppd to 1/2 ppd Not ready to quit  Vitamin D deficiency disease -     VITAMIN D 25 Hydroxy (Vit-D Deficiency, Fractures)    Patient has been counseled on age-appropriate routine health concerns for screening and prevention. These are reviewed and up-to-date. Referrals have been placed accordingly. Immunizations are up-to-date or declined.    Subjective:   Chief Complaint  Patient presents with  . Medication Refill    Pt. is here for medication refills.    HPI Rebecca Pope 68 y.o. female presents to office today for follow up.  has a past medical history of GERD (gastroesophageal reflux disease), Hyperlipidemia, Hypertension, and Osteopenia determined by x-ray.  Unfortunately she has not been taking any vitamin D but she does endorse taking calcium but unsure of the dosage.  I have instructed her to take Calcium+D [phone] of vitamin D and 1200 of Calcium to help reduce the risk of osteoporosis. She verbalized understanding.    Essential Hypertension Chronic and well controlled. She is taking Norvasc 5 mg daily and HCTZ 25 mg daily as prescribed. She does not monitor her blood pressure at home. Denies  chest pain, shortness of breath, palpitations, lightheadedness, dizziness, headaches or BLE edema. Weight is stable.  BP Readings from Last 3 Encounters:  02/20/19 102/77  12/20/17 118/73  11/28/17 119/80    Hyperlipidemia Patient presents for follow up to hyperlipidemia.  She is medication compliant taking Mevacor 20 mg daily as prescribed. She is not diet compliant and denies statin intolerance including myalgias.  Lab Results  Component Value Date   CHOL 255 (H) 12/27/2017   Lab Results  Component Value Date   HDL 46 12/27/2017   Lab Results  Component Value Date   LDLCALC 186 (H)  12/27/2017   Lab Results  Component Value Date   TRIG 117 12/27/2017   Lab Results  Component Value Date   CHOLHDL 5.5 (H) 12/27/2017   Review of Systems  Constitutional: Negative for fever, malaise/fatigue and weight loss.  HENT: Negative.  Negative for nosebleeds.   Eyes: Negative.  Negative for blurred vision, double vision and photophobia.  Respiratory: Negative.  Negative for cough and shortness of breath.   Cardiovascular: Negative.  Negative for chest pain, palpitations and leg swelling.  Gastrointestinal: Positive for heartburn. Negative for nausea and vomiting.  Musculoskeletal: Negative.  Negative for myalgias.  Neurological: Negative.  Negative for dizziness, focal weakness, seizures and headaches.  Psychiatric/Behavioral: Negative.  Negative for suicidal ideas.    Past Medical History:  Diagnosis Date  . GERD (gastroesophageal reflux disease)   . Hyperlipidemia   . Hypertension   . Osteopenia determined by x-ray     Past Surgical History:  Procedure Laterality Date  . APPENDECTOMY      Family History  Problem Relation Age of Onset  . Cancer Mother   . Hypertension Mother   . Cancer Father     Social History Reviewed with no changes to be made today.   Outpatient Medications Prior to Visit  Medication Sig Dispense Refill  . amLODipine (NORVASC) 5 MG tablet Take 1 tablet (5 mg total) by mouth daily. 30 tablet 0  . hydrochlorothiazide (HYDRODIURIL) 25 MG tablet Take 1 tablet (25 mg total) by mouth daily. NEEDS APPT! 30 tablet 0  . Vitamin D, Ergocalciferol, (DRISDOL) 50000 units CAPS capsule Take 1 capsule (50,000 Units total) by mouth every 7 (seven) days. (Patient not taking: Reported on 02/20/2019) 16 capsule 1  . aspirin EC 81 MG tablet Take 1 tablet (81 mg total) by mouth daily. (Patient not taking: Reported on 12/20/2017) 30 tablet 3  . lovastatin (MEVACOR) 20 MG tablet Take 1 tablet (20 mg total) by mouth at bedtime. (Patient not taking: Reported on  02/20/2019) 90 tablet 1  . omeprazole (PRILOSEC) 20 MG capsule Take 1 capsule (20 mg total) by mouth daily. (Patient not taking: Reported on 02/20/2019) 30 capsule 3  . potassium chloride (K-DUR) 10 MEQ tablet Take 1 tablet (10 mEq total) by mouth daily. (Patient not taking: Reported on 02/20/2019) 30 tablet 2   No facility-administered medications prior to visit.     No Known Allergies     Objective:    BP 102/77 (BP Location: Left Arm, Patient Position: Sitting, Cuff Size: Normal)   Pulse 71   Temp 98.9 F (37.2 C) (Oral)   Ht '5\' 4"'  (1.626 m)   Wt 191 lb (86.6 kg)   SpO2 96%   BMI 32.79 kg/m  Wt Readings from Last 3 Encounters:  02/20/19 191 lb (86.6 kg)  12/20/17 192 lb 12.8 oz (87.5 kg)  11/28/17 195 lb (88.5 kg)    Physical  Exam Vitals signs and nursing note reviewed.  Constitutional:      Appearance: She is well-developed.  HENT:     Head: Normocephalic and atraumatic.  Neck:     Musculoskeletal: Normal range of motion.  Cardiovascular:     Rate and Rhythm: Normal rate and regular rhythm.     Heart sounds: Normal heart sounds. No murmur. No friction rub. No gallop.   Pulmonary:     Effort: Pulmonary effort is normal. No tachypnea or respiratory distress.     Breath sounds: Normal breath sounds. No decreased breath sounds, wheezing, rhonchi or rales.  Chest:     Chest wall: No tenderness.  Abdominal:     General: Bowel sounds are normal.     Palpations: Abdomen is soft.  Musculoskeletal: Normal range of motion.  Skin:    General: Skin is warm and dry.  Neurological:     Mental Status: She is alert and oriented to person, place, and time.     Coordination: Coordination normal.  Psychiatric:        Behavior: Behavior normal. Behavior is cooperative.        Thought Content: Thought content normal.        Judgment: Judgment normal.        Patient has been counseled extensively about nutrition and exercise as well as the importance of adherence with  medications and regular follow-up. The patient was given clear instructions to go to ER or return to medical center if symptoms don't improve, worsen or new problems develop. The patient verbalized understanding.   Follow-up: Return in about 3 months (around 05/23/2019).   Gildardo Pounds, FNP-BC [facility] [place], [place]   02/20/2019, 6:28 PM

## 2019-02-21 LAB — CBC
Hematocrit: 42.1 % (ref 34.0–46.6)
Hemoglobin: 13.5 g/dL (ref 11.1–15.9)
MCH: 24.7 pg — ABNORMAL LOW (ref 26.6–33.0)
MCHC: 32.1 g/dL (ref 31.5–35.7)
MCV: 77 fL — ABNORMAL LOW (ref 79–97)
Platelets: 216 10*3/uL (ref 150–450)
RBC: 5.46 x10E6/uL — ABNORMAL HIGH (ref 3.77–5.28)
RDW: 15.3 % (ref 11.7–15.4)
WBC: 5 10*3/uL (ref 3.4–10.8)

## 2019-02-21 LAB — CMP14+EGFR
ALT: 10 IU/L (ref 0–32)
AST: 17 IU/L (ref 0–40)
Albumin/Globulin Ratio: 1.5 (ref 1.2–2.2)
Albumin: 4.5 g/dL (ref 3.8–4.8)
Alkaline Phosphatase: 80 IU/L (ref 39–117)
BUN/Creatinine Ratio: 16 (ref 12–28)
BUN: 19 mg/dL (ref 8–27)
Bilirubin Total: <0.2 mg/dL (ref 0.0–1.2)
CO2: 29 mmol/L (ref 20–29)
Calcium: 9.8 mg/dL (ref 8.7–10.3)
Chloride: 98 mmol/L (ref 96–106)
Creatinine, Ser: 1.16 mg/dL — ABNORMAL HIGH (ref 0.57–1.00)
GFR calc Af Amer: 56 mL/min/{1.73_m2} — ABNORMAL LOW (ref >59)
GFR calc non Af Amer: 48 mL/min/{1.73_m2} — ABNORMAL LOW (ref >59)
Globulin, Total: 3 g/dL (ref 1.5–4.5)
Glucose: 95 mg/dL (ref 65–99)
Potassium: 3.2 mmol/L — ABNORMAL LOW (ref 3.5–5.2)
Sodium: 145 mmol/L — ABNORMAL HIGH (ref 134–144)
Total Protein: 7.5 g/dL (ref 6.0–8.5)

## 2019-02-21 LAB — LIPID PANEL
Chol/HDL Ratio: 4.6 ratio — ABNORMAL HIGH (ref 0.0–4.4)
Cholesterol, Total: 243 mg/dL — ABNORMAL HIGH (ref 100–199)
HDL: 53 mg/dL (ref >39)
LDL Calculated: 165 mg/dL — ABNORMAL HIGH (ref 0–99)
Triglycerides: 124 mg/dL (ref 0–149)
VLDL Cholesterol Cal: 25 mg/dL (ref 5–40)

## 2019-02-21 LAB — VITAMIN D 25 HYDROXY (VIT D DEFICIENCY, FRACTURES): Vit D, 25-Hydroxy: 18.2 ng/mL — ABNORMAL LOW (ref 30.0–100.0)

## 2019-02-22 ENCOUNTER — Other Ambulatory Visit: Payer: Self-pay | Admitting: Nurse Practitioner

## 2019-02-22 DIAGNOSIS — I10 Essential (primary) hypertension: Secondary | ICD-10-CM

## 2019-02-22 MED ORDER — AMLODIPINE BESYLATE 10 MG PO TABS: 10.0000 mg | ORAL_TABLET | Freq: Every day | ORAL | 1 refills | Status: DC

## 2019-03-12 ENCOUNTER — Encounter: Payer: Self-pay | Admitting: Pharmacist

## 2019-03-12 ENCOUNTER — Other Ambulatory Visit: Payer: Self-pay

## 2019-03-12 ENCOUNTER — Ambulatory Visit: Payer: Medicare Other | Attending: Nurse Practitioner | Admitting: Pharmacist

## 2019-03-12 VITALS — BP 106/69 | HR 68

## 2019-03-12 DIAGNOSIS — I10 Essential (primary) hypertension: Secondary | ICD-10-CM

## 2019-03-12 MED ORDER — ROSUVASTATIN CALCIUM 20 MG PO TABS: 20.0000 mg | ORAL_TABLET | Freq: Every day | ORAL | 0 refills | Status: DC

## 2019-03-12 NOTE — Progress Notes (Signed)
   S:    PCP: Zelda   Patient arrives in good spirits. Presents to the clinic for hypertension evaluation, counseling, and management.  Patient was referred and last seen by Primary Care Provider on 02/20/19.  Of note, labs from that appointment revealed hypokalemia and decrease in renal function. Pt's HCTZ was stopped and amlodipine was increased to 10 mg daily.  Patient reports adherence with medications.  Patient denies chest pain, dyspnea, HA or blurred vision.  Current BP Medications include:  Amlodipine 10 mg (5 mg tablets; 2 daily)  Dietary habits include: compliant with salt restriction; drinks coffee and soda daily Exercise habits include: denies regular exercise  Family / Social history:  - Tobacco: current every day smoker; reports 5 cigarettes/day (smoked ~1 hour ago) - Alcohol: denies   O:  Home BP readings: has not checked at home since med change  Last 3 Office BP readings: BP Readings from Last 3 Encounters:  03/12/19 106/69  02/20/19 102/77  12/20/17 118/73    BMET    Component Value Date/Time   NA 145 (H) 02/20/2019 1636   K 3.2 (L) 02/20/2019 1636   CL 98 02/20/2019 1636   CO2 29 02/20/2019 1636   GLUCOSE 95 02/20/2019 1636   GLUCOSE 73 06/14/2016 1545   BUN 19 02/20/2019 1636   CREATININE 1.16 (H) 02/20/2019 1636   CREATININE 1.04 (H) 06/14/2016 1545   CALCIUM 9.8 02/20/2019 1636   GFRNONAA 48 (L) 02/20/2019 1636   GFRNONAA 62 11/06/2013 0918   GFRAA 56 (L) 02/20/2019 1636   GFRAA 71 11/06/2013 0918    Renal function: CrCl cannot be calculated (Unknown ideal weight.).  Clinical ASCVD: No  The 10-year ASCVD risk score Mikey Bussing DC Jr., et al., 2013) is: 17.8%   Values used to calculate the score:     Age: 68 years     Sex: Female     Is Non-Hispanic African American: Yes     Diabetic: No     Tobacco smoker: Yes     Systolic Blood Pressure: 761 mmHg     Is BP treated: Yes     HDL Cholesterol: 53 mg/dL     Total Cholesterol: 243 mg/dL    A/P: Hypertension longstanding currently controlled on current medications. BP Goal = <130/80 mmHg. Patient is adherent with current medications.  -Continued amlodipine 10 mg daily.  -Counseled on lifestyle modifications for blood pressure control including reduced dietary sodium, increased exercise, adequate sleep -HM: flu vaccine postponed   Results reviewed and written information provided.   Total time in face-to-face counseling 15 minutes.   F/U Clinic Visit in Dec with PCP.    Patient seen with: Dillard Essex PharmD Candidate  Class of 2022 [place], PharmD, [place] [phone]

## 2019-03-12 NOTE — Patient Instructions (Signed)

## 2019-03-13 ENCOUNTER — Encounter: Payer: Self-pay | Admitting: Pharmacist

## 2019-04-20 LAB — FECAL OCCULT BLOOD, IMMUNOCHEMICAL

## 2019-04-22 ENCOUNTER — Other Ambulatory Visit: Payer: Self-pay | Admitting: Nurse Practitioner

## 2019-04-22 DIAGNOSIS — I10 Essential (primary) hypertension: Secondary | ICD-10-CM

## 2019-05-27 ENCOUNTER — Other Ambulatory Visit: Payer: Self-pay | Admitting: Nurse Practitioner

## 2019-05-27 DIAGNOSIS — I10 Essential (primary) hypertension: Secondary | ICD-10-CM

## 2019-05-28 ENCOUNTER — Other Ambulatory Visit: Payer: Self-pay

## 2019-05-28 ENCOUNTER — Ambulatory Visit: Payer: Medicare Other | Attending: Nurse Practitioner

## 2019-05-28 DIAGNOSIS — I10 Essential (primary) hypertension: Secondary | ICD-10-CM

## 2019-05-29 ENCOUNTER — Ambulatory Visit: Payer: Medicare Other | Attending: Nurse Practitioner | Admitting: Nurse Practitioner

## 2019-05-29 ENCOUNTER — Encounter: Payer: Self-pay | Admitting: Nurse Practitioner

## 2019-05-29 ENCOUNTER — Other Ambulatory Visit: Payer: Self-pay | Admitting: Nurse Practitioner

## 2019-05-29 DIAGNOSIS — E559 Vitamin D deficiency, unspecified: Secondary | ICD-10-CM | POA: Diagnosis not present

## 2019-05-29 DIAGNOSIS — F17210 Nicotine dependence, cigarettes, uncomplicated: Secondary | ICD-10-CM | POA: Diagnosis not present

## 2019-05-29 DIAGNOSIS — I10 Essential (primary) hypertension: Secondary | ICD-10-CM

## 2019-05-29 LAB — VITAMIN D 25 HYDROXY (VIT D DEFICIENCY, FRACTURES): Vit D, 25-Hydroxy: 23.6 ng/mL — ABNORMAL LOW (ref 30.0–100.0)

## 2019-05-29 LAB — CBC
Hematocrit: 43.8 % (ref 34.0–46.6)
Hemoglobin: 13.5 g/dL (ref 11.1–15.9)
MCH: 24.3 pg — ABNORMAL LOW (ref 26.6–33.0)
MCHC: 30.8 g/dL — ABNORMAL LOW (ref 31.5–35.7)
MCV: 79 fL (ref 79–97)
Platelets: 212 10*3/uL (ref 150–450)
RBC: 5.56 x10E6/uL — ABNORMAL HIGH (ref 3.77–5.28)
RDW: 16.2 % — ABNORMAL HIGH (ref 11.7–15.4)
WBC: 5.9 10*3/uL (ref 3.4–10.8)

## 2019-05-29 LAB — LIPID PANEL
Chol/HDL Ratio: 3.6 ratio (ref 0.0–4.4)
Cholesterol, Total: 193 mg/dL (ref 100–199)
HDL: 54 mg/dL (ref >39)
LDL Chol Calc (NIH): 112 mg/dL — ABNORMAL HIGH (ref 0–99)
Triglycerides: 152 mg/dL — ABNORMAL HIGH (ref 0–149)
VLDL Cholesterol Cal: 27 mg/dL (ref 5–40)

## 2019-05-29 LAB — CMP14+EGFR
ALT: 7 IU/L (ref 0–32)
AST: 13 IU/L (ref 0–40)
Albumin/Globulin Ratio: 1.4 (ref 1.2–2.2)
Albumin: 4.4 g/dL (ref 3.8–4.8)
Alkaline Phosphatase: 92 IU/L (ref 39–117)
BUN/Creatinine Ratio: 15 (ref 12–28)
BUN: 18 mg/dL (ref 8–27)
Bilirubin Total: <0.2 mg/dL (ref 0.0–1.2)
CO2: 26 mmol/L (ref 20–29)
Calcium: 9.5 mg/dL (ref 8.7–10.3)
Chloride: 100 mmol/L (ref 96–106)
Creatinine, Ser: 1.19 mg/dL — ABNORMAL HIGH (ref 0.57–1.00)
GFR calc Af Amer: 54 mL/min/{1.73_m2} — ABNORMAL LOW (ref >59)
GFR calc non Af Amer: 47 mL/min/{1.73_m2} — ABNORMAL LOW (ref >59)
Globulin, Total: 3.2 g/dL (ref 1.5–4.5)
Glucose: 111 mg/dL — ABNORMAL HIGH (ref 65–99)
Potassium: 4.1 mmol/L (ref 3.5–5.2)
Sodium: 139 mmol/L (ref 134–144)
Total Protein: 7.6 g/dL (ref 6.0–8.5)

## 2019-05-29 MED ORDER — VITAMIN D (ERGOCALCIFEROL) 1.25 MG (50000 UNIT) PO CAPS: 50000.0000 [IU] | ORAL_CAPSULE | ORAL | 1 refills | Status: DC

## 2019-05-29 NOTE — Progress Notes (Signed)
Virtual Visit via Telephone Note Due to national recommendations of social distancing due to COVID 19, telehealth visit is felt to be most appropriate for this patient at this time.  I discussed the limitations, risks, security and privacy concerns of performing an evaluation and management service by telephone and the availability of in person appointments. I also discussed with the patient that there may be a patient responsible charge related to this service. The patient expressed understanding and agreed to proceed.    I connected with Rebecca Pope on 05/29/19  at  11:10 AM EST  EDT by telephone and verified that I am speaking with the correct person using two identifiers.   Consent I discussed the limitations, risks, security and privacy concerns of performing an evaluation and management service by telephone and the availability of in person appointments. I also discussed with the patient that there may be a patient responsible charge related to this service. The patient expressed understanding and agreed to proceed.   Location of Patient: Private Residence   [facility] of Provider: [facility]    Persons participating in Telemedicine visit: [name] FNP-BC YY [place]one CMA Rebecca Pope    History of Present Illness: Telemedicine visit for: Follow Up  We went over her lab results in detail today and all questions were answered.  Patient verbalized understanding.  Her mother passed in November and her stepfather passed this week. Lots of stress with planning 2 funerals. Just buried her mother last week. However she declines starting any SSRIs today.   Essential Hypertension She has a blood pressure monitor at home. Notes readings "pretty good" but can not recall any specific readings. She endorses medication compliance taking amlodipine 10 mg daily. Denies chest pain, shortness of breath, palpitations, lightheadedness, dizziness, headaches or BLE  edema.  BP Readings from Last 3 Encounters:  03/12/19 106/69  02/20/19 102/77  12/20/17 118/73    Hyperlipidemia Patient presents for follow up to hyperlipidemia.  She is medication compliant. She is not consistently diet compliant and denies statin intolerance including myalgias. LDL not at goal <90.  Lab Results  Component Value Date   CHOL 193 05/28/2019   Lab Results  Component Value Date   HDL 54 05/28/2019   Lab Results  Component Value Date   LDLCALC 112 (H) 05/28/2019   Lab Results  Component Value Date   TRIG 152 (H) 05/28/2019   Lab Results  Component Value Date   CHOLHDL 3.6 05/28/2019     Past Medical History:  Diagnosis Date  . GERD (gastroesophageal reflux disease)   . Hyperlipidemia   . Hypertension   . Osteopenia determined by x-ray     Past Surgical History:  Procedure Laterality Date  . APPENDECTOMY      Family History  Problem Relation Age of Onset  . Cancer Mother   . Hypertension Mother   . Cancer Father     Social History   Socioeconomic History  . Marital status: Single    Spouse name: Not on file  . Number of children: Not on file  . Years of education: Not on file  . Highest education level: Not on file  Occupational History  . Not on file  Social Needs  . Financial resource strain: Not on file  . Food insecurity    Worry: Not on file    Inability: Not on file  . Transportation needs    Medical: Not on file    Non-medical: Not  on file  Tobacco Use  . Smoking status: Current Every Day Smoker    Packs/day: 1.00    Types: Cigarettes  . Smokeless tobacco: Never Used  Substance and Sexual Activity  . Alcohol use: No  . Drug use: No  . Sexual activity: Not Currently  Lifestyle  . Physical activity    Days per week: Not on file    Minutes per session: Not on file  . Stress: Not on file  Relationships  . Social Herbalist on phone: Not on file    Gets together: Not on file    Attends religious service: Not  on file    Active member of club or organization: Not on file    Attends meetings of clubs or organizations: Not on file    Relationship status: Not on file  Other Topics Concern  . Not on file  Social History Narrative  . Not on file     Observations/Objective: Awake, alert and oriented x 3   Review of Systems  Constitutional: Negative for fever, malaise/fatigue and weight loss.  HENT: Negative.  Negative for nosebleeds.   Eyes: Negative.  Negative for blurred vision, double vision and photophobia.  Respiratory: Negative.  Negative for cough and shortness of breath.   Cardiovascular: Negative.  Negative for chest pain, palpitations and leg swelling.  Gastrointestinal: Negative.  Negative for heartburn, nausea and vomiting.  Musculoskeletal: Negative.  Negative for myalgias.  Neurological: Negative.  Negative for dizziness, focal weakness, seizures and headaches.  Psychiatric/Behavioral: Negative for suicidal ideas. The patient is not nervous/anxious.     Assessment and Plan: Enis was seen today for follow-up.  Diagnoses and all orders for this visit:  Essential hypertension Continue all antihypertensives as prescribed.  Remember to bring in your blood pressure log with you for your follow up appointment.  DASH/Mediterranean Diets are healthier choices for HTN.    Vitamin D deficiency -     Vitamin D, Ergocalciferol, (DRISDOL) 1.25 MG (50000 UT) CAPS capsule; Take 1 capsule (50,000 Units total) by mouth every 7 (seven) days. 90 day supply  Tobacco dependence due to cigarettes Djuna was counseled on the dangers of tobacco use, and was advised to quit. Reviewed strategies to maximize success, including removing cigarettes and smoking materials from environment, stress management and support of family/friends as well as pharmacological alternatives including: Wellbutrin, Chantix, Nicotine patch, Nicotine gum or lozenges. Smoking cessation support: smoking cessation hotline:  1-800-QUIT-NOW.  Smoking cessation classes are also available through [facility] and [facility]. Call [phone] or visit our website at [email or web address]   A total of 3 minutes was spent on counseling for smoking cessation and Rylyn is not ready to quit.     Follow Up Instructions Return in about 3 months (around 08/27/2019).     I discussed the assessment and treatment plan with the patient. The patient was provided an opportunity to ask questions and all were answered. The patient agreed with the plan and demonstrated an understanding of the instructions.   The patient was advised to call back or seek an in-person evaluation if the symptoms worsen or if the condition fails to improve as anticipated.  I provided 18 minutes of non-face-to-face time during this encounter including median intraservice time, reviewing previous notes, labs, imaging, medications and explaining diagnosis and management.  Gildardo Pounds, FNP-BC

## 2019-08-25 ENCOUNTER — Other Ambulatory Visit: Payer: Self-pay | Admitting: Nurse Practitioner

## 2019-08-25 DIAGNOSIS — I10 Essential (primary) hypertension: Secondary | ICD-10-CM

## 2019-08-25 DIAGNOSIS — K219 Gastro-esophageal reflux disease without esophagitis: Secondary | ICD-10-CM

## 2019-08-27 ENCOUNTER — Encounter: Payer: Self-pay | Admitting: Nurse Practitioner

## 2019-08-27 ENCOUNTER — Ambulatory Visit: Payer: Medicare Other | Attending: Nurse Practitioner | Admitting: Nurse Practitioner

## 2019-08-27 ENCOUNTER — Other Ambulatory Visit: Payer: Self-pay

## 2019-08-27 VITALS — BP 141/81 | HR 63 | Temp 98.2°F | Ht 64.0 in | Wt 194.0 lb

## 2019-08-27 DIAGNOSIS — I10 Essential (primary) hypertension: Secondary | ICD-10-CM

## 2019-08-27 DIAGNOSIS — F17210 Nicotine dependence, cigarettes, uncomplicated: Secondary | ICD-10-CM | POA: Diagnosis not present

## 2019-08-27 DIAGNOSIS — E785 Hyperlipidemia, unspecified: Secondary | ICD-10-CM | POA: Diagnosis not present

## 2019-08-27 DIAGNOSIS — F17200 Nicotine dependence, unspecified, uncomplicated: Secondary | ICD-10-CM | POA: Diagnosis not present

## 2019-08-27 DIAGNOSIS — Z122 Encounter for screening for malignant neoplasm of respiratory organs: Secondary | ICD-10-CM

## 2019-08-27 DIAGNOSIS — R7303 Prediabetes: Secondary | ICD-10-CM

## 2019-08-27 DIAGNOSIS — Z1211 Encounter for screening for malignant neoplasm of colon: Secondary | ICD-10-CM

## 2019-08-27 DIAGNOSIS — D649 Anemia, unspecified: Secondary | ICD-10-CM

## 2019-08-27 NOTE — Patient Instructions (Signed)
[place]  [phone]  You can also call the COVID-19 Line [phone].  [facility] [employer] [facility] [place]. [facility] Address: [address] [place], [place], [place] [address], [place] Phone: [phone] Website: [email or web address]  [facility] Address: [address] [place], [place] [address], [place] Phone: [phone] Website: [email or web address]

## 2019-08-27 NOTE — Progress Notes (Signed)
Assessment & Plan:  Rebecca Pope was seen today for follow-up.  Diagnoses and all orders for this visit:  Essential hypertension -     CMP14+EGFR Continue all antihypertensives as prescribed.  Remember to bring in your blood pressure log with you for your follow up appointment.  DASH/Mediterranean Diets are healthier choices for HTN.    Prediabetes -     Hemoglobin A1c  Dyslipidemia, goal LDL below 100 -     Lipid panel INSTRUCTIONS: Work on a low fat, heart healthy diet and participate in regular aerobic exercise program by working out at least 150 minutes per week; 5 days a week-30 minutes per day. Avoid red meat/beef/steak,  fried foods. junk foods, sodas, sugary drinks, unhealthy snacking, alcohol and smoking.  Drink at least 80 oz of water per day and monitor your carbohydrate intake daily.    Anemia, unspecified type -     CBC  Encounter for screening for lung cancer -     CT CHEST LUNG CA SCREEN LOW DOSE W/O CM; Future  Tobacco dependence -     CT CHEST LUNG CA SCREEN LOW DOSE W/O CM; Future [name] was counseled on the dangers of tobacco use, and was advised to quit. Reviewed strategies to maximize success, including removing cigarettes and smoking materials from environment, stress management and support of family/friends as well as pharmacological alternatives including: Wellbutrin, Chantix, Nicotine patch, Nicotine gum or lozenges. Smoking cessation support: smoking cessation hotline: 1-800-QUIT-NOW.  Smoking cessation classes are also available through [facility]. Call 612-016-3771 or visit our website at [email or web address]   A total of 3 minutes was spent on counseling for smoking cessation and Madgie is not ready to quit.    Patient has been counseled on age-appropriate routine health concerns for screening and prevention. These are reviewed and up-to-date. Referrals have been placed accordingly. Immunizations are up-to-date or declined.     Subjective:   Chief Complaint  Patient presents with  . Follow-up    Pt. is here for a 3 months follow up.    HPI Rebecca Pope 69 y.o. female presents to office today for follow up.  has a past medical history of GERD (gastroesophageal reflux disease), Hyperlipidemia, Hypertension, and Osteopenia determined by x-ray.    Essential Hypertension She does not monitor her blood pressure at home. Taking amlodipine 10 mg daily as prescribed. Denies chest pain, shortness of breath, palpitations, lightheadedness, dizziness, headaches or BLE edema.  BP Readings from Last 3 Encounters:  08/27/19 (!) 141/81  03/12/19 106/69  02/20/19 102/77   Dyslipidemia LDL not at goal of <100. Taking crestor 20 mg daily. Denies statin intolerance.  Lab Results  Component Value Date   LDLCALC 142 (H) 08/27/2019    Review of Systems  Constitutional: Negative for fever, malaise/fatigue and weight loss.  HENT: Negative.  Negative for nosebleeds.   Eyes: Negative.  Negative for blurred vision, double vision and photophobia.  Respiratory: Negative.  Negative for cough and shortness of breath.   Cardiovascular: Negative.  Negative for chest pain, palpitations and leg swelling.  Gastrointestinal: Negative.  Negative for heartburn, nausea and vomiting.  Musculoskeletal: Negative.  Negative for myalgias.  Neurological: Negative.  Negative for dizziness, focal weakness, seizures and headaches.  Psychiatric/Behavioral: Negative.  Negative for suicidal ideas.    Past Medical History:  Diagnosis Date  . GERD (gastroesophageal reflux disease)   . Hyperlipidemia   . Hypertension   . Osteopenia determined by x-ray     Past  Surgical History:  Procedure Laterality Date  . APPENDECTOMY      Family History  Problem Relation Age of Onset  . Cancer Mother   . Hypertension Mother   . Cancer Father     Social History Reviewed with no changes to be made today.   Outpatient Medications Prior to Visit   Medication Sig Dispense Refill  . amLODipine (NORVASC) 10 MG tablet TAKE 1 TABLET(10 MG) BY MOUTH DAILY 30 tablet 1  . aspirin EC 81 MG tablet Take 1 tablet (81 mg total) by mouth daily. 90 tablet 3  . omeprazole (PRILOSEC) 20 MG capsule TAKE 1 CAPSULE(20 MG) BY MOUTH DAILY 90 capsule 0  . potassium chloride (KLOR-CON) 10 MEQ tablet TAKE 1 TABLET(10 MEQ) BY MOUTH DAILY 30 tablet 2  . rosuvastatin (CRESTOR) 20 MG tablet Take 1 tablet (20 mg total) by mouth daily. 90 tablet 0  . Vitamin D, Ergocalciferol, (DRISDOL) 1.25 MG (50000 UT) CAPS capsule Take 1 capsule (50,000 Units total) by mouth every 7 (seven) days. 90 day supply 16 capsule 1   No facility-administered medications prior to visit.    No Known Allergies     Objective:    BP (!) 141/81 (BP Location: Left Arm, Patient Position: Sitting, Cuff Size: Normal)   Pulse 63   Temp 98.2 F (36.8 C) (Temporal)   Ht '5\' 4"'  (1.626 m)   Wt 194 lb (88 kg)   SpO2 93%   BMI 33.30 kg/m  Wt Readings from Last 3 Encounters:  08/27/19 194 lb (88 kg)  02/20/19 191 lb (86.6 kg)  12/20/17 192 lb 12.8 oz (87.5 kg)    Physical Exam Vitals and nursing note reviewed.  Constitutional:      Appearance: She is well-developed.  HENT:     Head: Normocephalic and atraumatic.  Cardiovascular:     Rate and Rhythm: Normal rate and regular rhythm.     Heart sounds: Normal heart sounds. No murmur. No friction rub. No gallop.   Pulmonary:     Effort: Pulmonary effort is normal. No tachypnea or respiratory distress.     Breath sounds: Normal breath sounds. No decreased breath sounds, wheezing, rhonchi or rales.  Chest:     Chest wall: No tenderness.  Abdominal:     General: Bowel sounds are normal.     Palpations: Abdomen is soft.  Musculoskeletal:        General: Normal range of motion.     Cervical back: Normal range of motion.  Skin:    General: Skin is warm and dry.  Neurological:     Mental Status: She is alert and oriented to person,  place, and time.     Coordination: Coordination normal.  Psychiatric:        Behavior: Behavior normal. Behavior is cooperative.        Thought Content: Thought content normal.        Judgment: Judgment normal.          Patient has been counseled extensively about nutrition and exercise as well as the importance of adherence with medications and regular follow-up. The patient was given clear instructions to go to ER or return to medical center if symptoms don't improve, worsen or new problems develop. The patient verbalized understanding.   Follow-up: Return in about 3 months (around 11/27/2019).   Gildardo Pounds, FNP-BC [facility] [place], [place]   09/25/2019, 10:29 AM

## 2019-08-28 ENCOUNTER — Ambulatory Visit: Payer: Medicare Other | Admitting: Nurse Practitioner

## 2019-08-28 LAB — LIPID PANEL
Chol/HDL Ratio: 3.6 ratio (ref 0.0–4.4)
Cholesterol, Total: 218 mg/dL — ABNORMAL HIGH (ref 100–199)
HDL: 60 mg/dL (ref >39)
LDL Chol Calc (NIH): 142 mg/dL — ABNORMAL HIGH (ref 0–99)
Triglycerides: 90 mg/dL (ref 0–149)
VLDL Cholesterol Cal: 16 mg/dL (ref 5–40)

## 2019-08-28 LAB — CMP14+EGFR
ALT: 7 IU/L (ref 0–32)
AST: 13 IU/L (ref 0–40)
Albumin/Globulin Ratio: 1.4 (ref 1.2–2.2)
Albumin: 4.3 g/dL (ref 3.8–4.8)
Alkaline Phosphatase: 90 IU/L (ref 39–117)
BUN/Creatinine Ratio: 15 (ref 12–28)
BUN: 15 mg/dL (ref 8–27)
Bilirubin Total: <0.2 mg/dL (ref 0.0–1.2)
CO2: 25 mmol/L (ref 20–29)
Calcium: 10.2 mg/dL (ref 8.7–10.3)
Chloride: 102 mmol/L (ref 96–106)
Creatinine, Ser: 0.99 mg/dL (ref 0.57–1.00)
GFR calc Af Amer: 67 mL/min/{1.73_m2} (ref >59)
GFR calc non Af Amer: 58 mL/min/{1.73_m2} — ABNORMAL LOW (ref >59)
Globulin, Total: 3 g/dL (ref 1.5–4.5)
Glucose: 83 mg/dL (ref 65–99)
Potassium: 3.8 mmol/L (ref 3.5–5.2)
Sodium: 142 mmol/L (ref 134–144)
Total Protein: 7.3 g/dL (ref 6.0–8.5)

## 2019-08-28 LAB — CBC
Hematocrit: 46.3 % (ref 34.0–46.6)
Hemoglobin: 14.1 g/dL (ref 11.1–15.9)
MCH: 24.4 pg — ABNORMAL LOW (ref 26.6–33.0)
MCHC: 30.5 g/dL — ABNORMAL LOW (ref 31.5–35.7)
MCV: 80 fL (ref 79–97)
Platelets: 198 10*3/uL (ref 150–450)
RBC: 5.78 x10E6/uL — ABNORMAL HIGH (ref 3.77–5.28)
RDW: 14.7 % (ref 11.7–15.4)
WBC: 4.9 10*3/uL (ref 3.4–10.8)

## 2019-08-28 LAB — HEMOGLOBIN A1C
Est. average glucose Bld gHb Est-mCnc: 123 mg/dL
Hgb A1c MFr Bld: 5.9 % — ABNORMAL HIGH (ref 4.8–5.6)

## 2019-09-25 ENCOUNTER — Encounter: Payer: Self-pay | Admitting: Nurse Practitioner

## 2019-09-25 LAB — FECAL OCCULT BLOOD, IMMUNOCHEMICAL: Fecal Occult Bld: NEGATIVE

## 2019-12-02 NOTE — Telephone Encounter (Signed)
Please disregard. Error.

## 2019-12-04 ENCOUNTER — Encounter: Payer: Self-pay | Admitting: Nurse Practitioner

## 2019-12-04 ENCOUNTER — Other Ambulatory Visit: Payer: Self-pay

## 2019-12-04 ENCOUNTER — Ambulatory Visit: Payer: Medicare Other | Attending: Nurse Practitioner | Admitting: Nurse Practitioner

## 2019-12-04 DIAGNOSIS — I10 Essential (primary) hypertension: Secondary | ICD-10-CM

## 2019-12-04 DIAGNOSIS — F17200 Nicotine dependence, unspecified, uncomplicated: Secondary | ICD-10-CM | POA: Diagnosis not present

## 2019-12-04 DIAGNOSIS — K219 Gastro-esophageal reflux disease without esophagitis: Secondary | ICD-10-CM | POA: Diagnosis not present

## 2019-12-04 DIAGNOSIS — E559 Vitamin D deficiency, unspecified: Secondary | ICD-10-CM

## 2019-12-04 DIAGNOSIS — E782 Mixed hyperlipidemia: Secondary | ICD-10-CM

## 2019-12-04 MED ORDER — AMLODIPINE BESYLATE 10 MG PO TABS: ORAL_TABLET | ORAL | 1 refills | Status: DC

## 2019-12-04 MED ORDER — VITAMIN D (ERGOCALCIFEROL) 1.25 MG (50000 UNIT) PO CAPS: 50000.0000 [IU] | ORAL_CAPSULE | ORAL | 1 refills | Status: DC

## 2019-12-04 MED ORDER — ROSUVASTATIN CALCIUM 20 MG PO TABS: 20.0000 mg | ORAL_TABLET | Freq: Every day | ORAL | 0 refills | Status: DC

## 2019-12-04 MED ORDER — POTASSIUM CHLORIDE ER 10 MEQ PO TBCR: EXTENDED_RELEASE_TABLET | ORAL | 2 refills | Status: DC

## 2019-12-04 MED ORDER — OMEPRAZOLE 20 MG PO CPDR: 20.0000 mg | DELAYED_RELEASE_CAPSULE | Freq: Every day | ORAL | 1 refills | Status: DC

## 2019-12-04 NOTE — Progress Notes (Signed)
Virtual Visit via Telephone Note Due to national recommendations of social distancing due to COVID 19, telehealth visit is felt to be most appropriate for this patient at this time.  I discussed the limitations, risks, security and privacy concerns of performing an evaluation and management service by telephone and the availability of in person appointments. I also discussed with the patient that there may be a patient responsible charge related to this service. The patient expressed understanding and agreed to proceed.    I connected with Rebecca Pope on 12/04/19  at   1:30 PM EDT  EDT by telephone and verified that I am speaking with the correct person using two identifiers.   Consent I discussed the limitations, risks, security and privacy concerns of performing an evaluation and management service by telephone and the availability of in person appointments. I also discussed with the patient that there may be a patient responsible charge related to this service. The patient expressed understanding and agreed to proceed.   Location of Patient: Private Residence    Location of Provider: [facility]    Persons participating in Telemedicine visit: [name] FNP-BC [place] [place] CMA Rebecca Pope    History of Present Illness: Telemedicine visit for: Follow Up  Rebecca Pope is currently out of town right now in [place] attending a family member's funeral services.  She has no questions or concerns today. Denies chest pain, shortness of breath, palpitations, lightheadedness, dizziness, headaches or BLE edema.  Endorses medication compliance taking amlodipine 10 mg daily. BP Readings from Last 3 Encounters:  08/27/19 (!) 141/81  03/12/19 106/69  02/20/19 102/77        Past Medical History:  Diagnosis Date  . GERD (gastroesophageal reflux disease)   . Hyperlipidemia   . Hypertension   . Osteopenia determined by x-ray     Past Surgical History:   Procedure Laterality Date  . APPENDECTOMY      Family History  Problem Relation Age of Onset  . Cancer Mother   . Hypertension Mother   . Cancer Father     Social History   Socioeconomic History  . Marital status: Single    Spouse name: Not on file  . Number of children: Not on file  . Years of education: Not on file  . Highest education level: Not on file  Occupational History  . Not on file  Tobacco Use  . Smoking status: Current Every Day Smoker    Packs/day: 1.00    Types: Cigarettes  . Smokeless tobacco: Never Used  Vaping Use  . Vaping Use: Never used  Substance and Sexual Activity  . Alcohol use: No  . Drug use: No  . Sexual activity: Not Currently  Other Topics Concern  . Not on file  Social History Narrative  . Not on file   Social Determinants of Health   Financial Resource Strain:   . Difficulty of Paying Living Expenses:   Food Insecurity:   . Worried About Programme researcher, broadcasting/film/video in the Last Year:   . Barista in the Last Year:   Transportation Needs:   . Freight forwarder (Medical):   Marland Kitchen Lack of Transportation (Non-Medical):   Physical Activity:   . Days of Exercise per Week:   . Minutes of Exercise per Session:   Stress:   . Feeling of Stress :   Social Connections:   . Frequency of Communication with Friends and Family:   .  Frequency of Social Gatherings with Friends and Family:   . Attends Religious Services:   . Active Member of Clubs or Organizations:   . Attends Archivist Meetings:   Marland Kitchen Marital Status:      Observations/Objective: Awake, alert and oriented x 3   Review of Systems  Constitutional: Negative for fever, malaise/fatigue and weight loss.  HENT: Negative.  Negative for nosebleeds.   Eyes: Negative.  Negative for blurred vision, double vision and photophobia.  Respiratory: Negative.  Negative for cough and shortness of breath.   Cardiovascular: Negative.  Negative for chest pain, palpitations and leg  swelling.  Gastrointestinal: Positive for heartburn. Negative for nausea and vomiting.  Musculoskeletal: Negative.  Negative for myalgias.  Neurological: Negative.  Negative for dizziness, focal weakness, seizures and headaches.  Psychiatric/Behavioral: Negative.  Negative for suicidal ideas.    Assessment and Plan: Rebecca Pope was seen today for hypertension.  Diagnoses and all orders for this visit:  Essential hypertension -     potassium chloride (KLOR-CON) 10 MEQ tablet; TAKE 1 TABLET(10 MEQ) BY MOUTH DAILY -     amLODipine (NORVASC) 10 MG tablet; TAKE 1 TABLET(10 MG) BY MOUTH DAILY Continue all antihypertensives as prescribed.  Remember to bring in your blood pressure log with you for your follow up appointment.  DASH/Mediterranean Diets are healthier choices for HTN.    Gastroesophageal reflux disease -     omeprazole (PRILOSEC) 20 MG capsule; Take 1 capsule (20 mg total) by mouth daily. INSTRUCTIONS: Avoid GERD Triggers: acidic, spicy or fried foods, caffeine, coffee, sodas,  alcohol and chocolate.   Vitamin D deficiency -     Vitamin D, Ergocalciferol, (DRISDOL) 1.25 MG (50000 UNIT) CAPS capsule; Take 1 capsule (50,000 Units total) by mouth every 7 (seven) days. 90 day supply  Tobacco dependence Rebecca Pope was counseled on the dangers of tobacco use, and was advised to quit. Reviewed strategies to maximize success, including removing cigarettes and smoking materials from environment, stress management and support of family/friends as well as pharmacological alternatives including: Wellbutrin, Chantix, Nicotine patch, Nicotine gum or lozenges. Smoking cessation support: smoking cessation hotline: 1-800-QUIT-NOW.  Smoking cessation classes are also available through [facility]. Call ([phone] [phone] or visit our website at [email or web address]   A total of 2 minutes was spent on counseling for smoking cessation and Rebecca Pope is not ready to quit.   Mixed  hyperlipidemia -     rosuvastatin (CRESTOR) 20 MG tablet; Take 1 tablet (20 mg total) by mouth daily. INSTRUCTIONS: Work on a low fat, heart healthy diet and participate in regular aerobic exercise program by working out at least 150 minutes per week; 5 days a week-30 minutes per day. Avoid red meat/beef/steak,  fried foods. junk foods, sodas, sugary drinks, unhealthy snacking, alcohol and smoking.  Drink at least 80 oz of water per day and monitor your carbohydrate intake daily.       Follow Up Instructions Return in about 3 months (around 03/05/2020).     I discussed the assessment and treatment plan with the patient. The patient was provided an opportunity to ask questions and all were answered. The patient agreed with the plan and demonstrated an understanding of the instructions.   The patient was advised to call back or seek an in-person evaluation if the symptoms worsen or if the condition fails to improve as anticipated.  I provided 16 minutes of non-face-to-face time during this encounter including median intraservice time, reviewing previous notes, labs, imaging, medications and  explaining diagnosis and management.  Gildardo Pounds, FNP-BC

## 2020-02-26 ENCOUNTER — Ambulatory Visit: Payer: Medicare Other | Attending: Nurse Practitioner | Admitting: Nurse Practitioner

## 2020-02-26 ENCOUNTER — Other Ambulatory Visit: Payer: Self-pay

## 2020-02-26 ENCOUNTER — Encounter: Payer: Self-pay | Admitting: Nurse Practitioner

## 2020-02-26 VITALS — BP 125/81 | HR 66 | Temp 97.7°F | Wt 188.0 lb

## 2020-02-26 DIAGNOSIS — Z1231 Encounter for screening mammogram for malignant neoplasm of breast: Secondary | ICD-10-CM

## 2020-02-26 DIAGNOSIS — E782 Mixed hyperlipidemia: Secondary | ICD-10-CM | POA: Diagnosis not present

## 2020-02-26 DIAGNOSIS — I10 Essential (primary) hypertension: Secondary | ICD-10-CM

## 2020-02-26 DIAGNOSIS — Z780 Asymptomatic menopausal state: Secondary | ICD-10-CM

## 2020-02-26 DIAGNOSIS — K219 Gastro-esophageal reflux disease without esophagitis: Secondary | ICD-10-CM

## 2020-02-26 DIAGNOSIS — R7303 Prediabetes: Secondary | ICD-10-CM

## 2020-02-26 DIAGNOSIS — D649 Anemia, unspecified: Secondary | ICD-10-CM | POA: Diagnosis not present

## 2020-02-26 LAB — GLUCOSE, POCT (MANUAL RESULT ENTRY): POC Glucose: 146 mg/dl — AB (ref 70–99)

## 2020-02-26 LAB — POCT GLYCOSYLATED HEMOGLOBIN (HGB A1C): Hemoglobin A1C: 5.7 % — AB (ref 4.0–5.6)

## 2020-02-26 MED ORDER — OMEPRAZOLE 20 MG PO CPDR: 20.0000 mg | DELAYED_RELEASE_CAPSULE | Freq: Every day | ORAL | 1 refills | Status: DC

## 2020-02-26 MED ORDER — POTASSIUM CHLORIDE ER 10 MEQ PO TBCR: EXTENDED_RELEASE_TABLET | ORAL | 2 refills | Status: DC

## 2020-02-26 MED ORDER — ROSUVASTATIN CALCIUM 20 MG PO TABS: 20.0000 mg | ORAL_TABLET | Freq: Every day | ORAL | 0 refills | Status: DC

## 2020-02-26 NOTE — Progress Notes (Signed)
Assessment & Plan:  Rebecca Pope was seen today for follow-up.  Diagnoses and all orders for this visit:  Essential hypertension -     potassium chloride (KLOR-CON) 10 MEQ tablet; TAKE 1 TABLET(10 MEQ) BY MOUTH DAILY -     Basic metabolic panel Continue all antihypertensives as prescribed.  Remember to bring in your blood pressure log with you for your follow up appointment.  DASH/Mediterranean Diets are healthier choices for HTN.    Prediabetes -     Glucose (CBG) -     HgB A1c  Gastroesophageal reflux disease, unspecified whether esophagitis present -     omeprazole (PRILOSEC) 20 MG capsule; Take 1 capsule (20 mg total) by mouth daily. INSTRUCTIONS: Avoid GERD Triggers: acidic, spicy or fried foods, caffeine, coffee, sodas,  alcohol and chocolate.   Mixed hyperlipidemia -     rosuvastatin (CRESTOR) 20 MG tablet; Take 1 tablet (20 mg total) by mouth daily. INSTRUCTIONS: Work on a low fat, heart healthy diet and participate in regular aerobic exercise program by working out at least 150 minutes per week; 5 days a week-30 minutes per day. Avoid red meat/beef/steak,  fried foods. junk foods, sodas, sugary drinks, unhealthy snacking, alcohol and smoking.  Drink at least 80 oz of water per day and monitor your carbohydrate intake daily.   Breast cancer screening by mammogram -     MM 3D SCREEN BREAST BILATERAL; Future  Postmenopausal estrogen deficiency -     DG Bone Density; Future  Anemia, unspecified type -     CBC    Patient has been counseled on age-appropriate routine health concerns for screening and prevention. These are reviewed and up-to-date. Referrals have been placed accordingly. Immunizations are up-to-date or declined.    Subjective:   Chief Complaint  Patient presents with   Follow-up    Pt. is here for 3 months F.U. on HTN and Prediabetes.    HPI Rebecca Pope 69 y.o. female presents to office today for follow up.  has a past medical history of GERD  (gastroesophageal reflux disease), Hyperlipidemia, Hypertension, and Osteopenia determined by x-ray.  Essential Hypertension Well controlled. She is taking amlodipine 10 mg daily. Denies chest pain, shortness of breath, palpitations, lightheadedness, dizziness, headaches or BLE edema. She continues to smoke however she is trying to "cut back". Down to about half a pack of cigarettes per day.  BP Readings from Last 3 Encounters:  02/26/20 125/81  08/27/19 (!) 141/81  03/12/19 106/69   Prediabetes Well controlled. She is not taking any diabetic medications at this time.  Lab Results  Component Value Date   HGBA1C 5.7 (A) 02/26/2020   Dyslipidemia Not well controlled. She is taking crestor 20 mg daily. Denis statin intolerance. Needs dietary and exercise adherence.  Lab Results  Component Value Date   CHOL 218 (H) 08/27/2019   CHOL 193 05/28/2019   CHOL 243 (H) 02/20/2019   Lab Results  Component Value Date   HDL 60 08/27/2019   HDL 54 05/28/2019   HDL 53 02/20/2019   Lab Results  Component Value Date   LDLCALC 142 (H) 08/27/2019   LDLCALC 112 (H) 05/28/2019   LDLCALC 165 (H) 02/20/2019   Lab Results  Component Value Date   TRIG 90 08/27/2019   TRIG 152 (H) 05/28/2019   TRIG 124 02/20/2019   Lab Results  Component Value Date   CHOLHDL 3.6 08/27/2019   CHOLHDL 3.6 05/28/2019   CHOLHDL 4.6 (H) 02/20/2019  Review of Systems  Constitutional: Negative for fever, malaise/fatigue and weight loss.  HENT: Negative.  Negative for nosebleeds.   Eyes: Negative.  Negative for blurred vision, double vision and photophobia.  Respiratory: Negative.  Negative for cough and shortness of breath.   Cardiovascular: Negative.  Negative for chest pain, palpitations and leg swelling.  Gastrointestinal: Negative for heartburn, nausea and vomiting.  Musculoskeletal: Negative.  Negative for myalgias.  Neurological: Negative.  Negative for dizziness, focal weakness, seizures and  headaches.  Psychiatric/Behavioral: Negative.  Negative for suicidal ideas.    Past Medical History:  Diagnosis Date   GERD (gastroesophageal reflux disease)    Hyperlipidemia    Hypertension    Osteopenia determined by x-ray     Past Surgical History:  Procedure Laterality Date   APPENDECTOMY      Family History  Problem Relation Age of Onset   Cancer Mother    Hypertension Mother    Cancer Father     Social History Reviewed with no changes to be made today.   Outpatient Medications Prior to Visit  Medication Sig Dispense Refill   amLODipine (NORVASC) 10 MG tablet TAKE 1 TABLET(10 MG) BY MOUTH DAILY 90 tablet 1   aspirin EC 81 MG tablet Take 1 tablet (81 mg total) by mouth daily. 90 tablet 3   Vitamin D, Ergocalciferol, (DRISDOL) 1.25 MG (50000 UNIT) CAPS capsule Take 1 capsule (50,000 Units total) by mouth every 7 (seven) days. 90 day supply 16 capsule 1   omeprazole (PRILOSEC) 20 MG capsule Take 1 capsule (20 mg total) by mouth daily. 90 capsule 1   potassium chloride (KLOR-CON) 10 MEQ tablet TAKE 1 TABLET(10 MEQ) BY MOUTH DAILY 30 tablet 2   rosuvastatin (CRESTOR) 20 MG tablet Take 1 tablet (20 mg total) by mouth daily. 90 tablet 0   No facility-administered medications prior to visit.    No Known Allergies     Objective:    BP 125/81 (BP Location: Left Arm, Patient Position: Sitting, Cuff Size: Normal)    Pulse 66    Temp 97.7 F (36.5 C) (Temporal)    Wt 188 lb (85.3 kg)    SpO2 95%    BMI 32.27 kg/m  Wt Readings from Last 3 Encounters:  02/26/20 188 lb (85.3 kg)  08/27/19 194 lb (88 kg)  02/20/19 191 lb (86.6 kg)    Physical Exam Vitals and nursing note reviewed.  Constitutional:      Appearance: She is well-developed.  HENT:     Head: Normocephalic and atraumatic.  Cardiovascular:     Rate and Rhythm: Normal rate and regular rhythm.     Heart sounds: Normal heart sounds. No murmur heard.  No friction rub. No gallop.   Pulmonary:      Effort: Pulmonary effort is normal. No tachypnea or respiratory distress.     Breath sounds: Normal breath sounds. No decreased breath sounds, wheezing, rhonchi or rales.  Chest:     Chest wall: No tenderness.  Abdominal:     General: Bowel sounds are normal.     Palpations: Abdomen is soft.  Musculoskeletal:        General: Normal range of motion.     Cervical back: Normal range of motion.  Skin:    General: Skin is warm and dry.  Neurological:     Mental Status: She is alert and oriented to person, place, and time.     Coordination: Coordination normal.  Psychiatric:        Behavior:  Behavior normal. Behavior is cooperative.        Thought Content: Thought content normal.        Judgment: Judgment normal.          Patient has been counseled extensively about nutrition and exercise as well as the importance of adherence with medications and regular follow-up. The patient was given clear instructions to go to ER or return to medical center if symptoms don't improve, worsen or new problems develop. The patient verbalized understanding.   Follow-up: Return in about 3 months (around 05/27/2020).   Claiborne Rigg, FNP-BC [facility] [place], [place] [phone]   02/28/2020, 11:17 PM

## 2020-02-27 LAB — BASIC METABOLIC PANEL
BUN/Creatinine Ratio: 18 (ref 12–28)
BUN: 21 mg/dL (ref 8–27)
CO2: 25 mmol/L (ref 20–29)
Calcium: 9.3 mg/dL (ref 8.7–10.3)
Chloride: 102 mmol/L (ref 96–106)
Creatinine, Ser: 1.14 mg/dL — ABNORMAL HIGH (ref 0.57–1.00)
GFR calc Af Amer: 56 mL/min/{1.73_m2} — ABNORMAL LOW (ref >59)
GFR calc non Af Amer: 49 mL/min/{1.73_m2} — ABNORMAL LOW (ref >59)
Glucose: 114 mg/dL — ABNORMAL HIGH (ref 65–99)
Potassium: 3.6 mmol/L (ref 3.5–5.2)
Sodium: 140 mmol/L (ref 134–144)

## 2020-02-27 LAB — CBC
Hematocrit: 42.7 % (ref 34.0–46.6)
Hemoglobin: 13.8 g/dL (ref 11.1–15.9)
MCH: 25.7 pg — ABNORMAL LOW (ref 26.6–33.0)
MCHC: 32.3 g/dL (ref 31.5–35.7)
MCV: 80 fL (ref 79–97)
Platelets: 208 10*3/uL (ref 150–450)
RBC: 5.37 x10E6/uL — ABNORMAL HIGH (ref 3.77–5.28)
RDW: 15.4 % (ref 11.7–15.4)
WBC: 5.1 10*3/uL (ref 3.4–10.8)

## 2020-02-28 ENCOUNTER — Encounter: Payer: Self-pay | Admitting: Nurse Practitioner

## 2020-03-17 ENCOUNTER — Ambulatory Visit (HOSPITAL_COMMUNITY): Payer: Medicare Other | Attending: Nurse Practitioner

## 2020-03-18 ENCOUNTER — Other Ambulatory Visit: Payer: Self-pay

## 2020-03-18 ENCOUNTER — Ambulatory Visit
Admission: RE | Admit: 2020-03-18 | Discharge: 2020-03-18 | Disposition: A | Discharge status: Home / Self Care | Payer: Medicare Other | Source: Ambulatory Visit | Attending: Nurse Practitioner | Admitting: Nurse Practitioner

## 2020-03-18 DIAGNOSIS — Z1231 Encounter for screening mammogram for malignant neoplasm of breast: Secondary | ICD-10-CM | POA: Diagnosis not present

## 2020-05-24 ENCOUNTER — Other Ambulatory Visit: Payer: Self-pay | Admitting: Nurse Practitioner

## 2020-05-24 DIAGNOSIS — E782 Mixed hyperlipidemia: Secondary | ICD-10-CM

## 2020-05-27 ENCOUNTER — Ambulatory Visit: Payer: Medicare Other | Admitting: Family

## 2020-05-27 ENCOUNTER — Ambulatory Visit: Payer: Medicare Other | Admitting: Nurse Practitioner

## 2020-07-20 ENCOUNTER — Ambulatory Visit: Payer: Medicare Other | Admitting: Nurse Practitioner

## 2020-07-20 ENCOUNTER — Other Ambulatory Visit: Payer: Self-pay

## 2020-07-26 ENCOUNTER — Other Ambulatory Visit: Payer: Self-pay

## 2020-07-26 ENCOUNTER — Other Ambulatory Visit: Payer: Self-pay | Admitting: Nurse Practitioner

## 2020-07-26 ENCOUNTER — Ambulatory Visit: Payer: Medicare Other | Attending: Nurse Practitioner

## 2020-07-26 DIAGNOSIS — I10 Essential (primary) hypertension: Secondary | ICD-10-CM

## 2020-07-26 DIAGNOSIS — E782 Mixed hyperlipidemia: Secondary | ICD-10-CM

## 2020-07-27 LAB — CMP14+EGFR
ALT: 9 IU/L (ref 0–32)
AST: 16 IU/L (ref 0–40)
Albumin/Globulin Ratio: 1.2 (ref 1.2–2.2)
Albumin: 4.1 g/dL (ref 3.8–4.8)
Alkaline Phosphatase: 83 IU/L (ref 44–121)
BUN/Creatinine Ratio: 17 (ref 12–28)
BUN: 18 mg/dL (ref 8–27)
Bilirubin Total: <0.2 mg/dL (ref 0.0–1.2)
CO2: 27 mmol/L (ref 20–29)
Calcium: 9.4 mg/dL (ref 8.7–10.3)
Chloride: 104 mmol/L (ref 96–106)
Creatinine, Ser: 1.05 mg/dL — ABNORMAL HIGH (ref 0.57–1.00)
GFR calc Af Amer: 62 mL/min/{1.73_m2} (ref >59)
GFR calc non Af Amer: 54 mL/min/{1.73_m2} — ABNORMAL LOW (ref >59)
Globulin, Total: 3.4 g/dL (ref 1.5–4.5)
Glucose: 132 mg/dL — ABNORMAL HIGH (ref 65–99)
Potassium: 3.8 mmol/L (ref 3.5–5.2)
Sodium: 144 mmol/L (ref 134–144)
Total Protein: 7.5 g/dL (ref 6.0–8.5)

## 2020-07-27 LAB — LIPID PANEL
Chol/HDL Ratio: 3.9 ratio (ref 0.0–4.4)
Cholesterol, Total: 193 mg/dL (ref 100–199)
HDL: 49 mg/dL (ref >39)
LDL Chol Calc (NIH): 123 mg/dL — ABNORMAL HIGH (ref 0–99)
Triglycerides: 119 mg/dL (ref 0–149)
VLDL Cholesterol Cal: 21 mg/dL (ref 5–40)

## 2020-08-29 ENCOUNTER — Ambulatory Visit: Payer: Medicare Other | Admitting: Nurse Practitioner

## 2020-09-03 ENCOUNTER — Other Ambulatory Visit: Payer: Self-pay | Admitting: Nurse Practitioner

## 2020-09-03 DIAGNOSIS — E782 Mixed hyperlipidemia: Secondary | ICD-10-CM

## 2020-09-03 NOTE — Telephone Encounter (Signed)
Requested Prescriptions  Pending Prescriptions Disp Refills  . rosuvastatin (CRESTOR) 20 MG tablet [Pharmacy Med Name: ROSUVASTATIN 20MG  TABLETS] 90 tablet 3    Sig: TAKE 1 TABLET(20 MG) BY MOUTH DAILY     Cardiovascular:  Antilipid - Statins Failed - 09/03/2020 11:38 AM      Failed - LDL in normal range and within 360 days    LDL Chol Calc (NIH)  Date Value Ref Range Status  07/26/2020 123 (H) 0 - 99 mg/dL Final         Passed - Total Cholesterol in normal range and within 360 days    Cholesterol, Total  Date Value Ref Range Status  07/26/2020 193 100 - 199 mg/dL Final         Passed - HDL in normal range and within 360 days    HDL  Date Value Ref Range Status  07/26/2020 49 >39 mg/dL Final         Passed - Triglycerides in normal range and within 360 days    Triglycerides  Date Value Ref Range Status  07/26/2020 119 0 - 149 mg/dL Final         Passed - Patient is not pregnant      Passed - Valid encounter within last 12 months    Recent Outpatient Visits          6 months ago Essential hypertension   [facility] [place], [place], NP   9 months ago Essential hypertension   [facility] [place], [place], NP   1 year ago Essential hypertension   [facility] [place], [place], NP   1 year ago Essential hypertension   [facility] [place], [place], NP   1 year ago Essential hypertension   [facility] [place], [place], RPH-CPP

## 2021-02-19 ENCOUNTER — Other Ambulatory Visit: Payer: Self-pay | Admitting: Nurse Practitioner

## 2021-02-19 DIAGNOSIS — I10 Essential (primary) hypertension: Secondary | ICD-10-CM

## 2021-02-19 NOTE — Telephone Encounter (Signed)
Requested medication (s) are due for refill today: yes  Requested medication (s) are on the active medication list: yes  Last refill:  12/04/19   Future visit scheduled: no  Notes to clinic:  called pt and LM on VM with call back number to make an appointment   Requested Prescriptions  Pending Prescriptions Disp Refills   amLODipine (NORVASC) 10 MG tablet [Pharmacy Med Name: AMLODIPINE BESYLATE 10MG  TABLETS] 90 tablet 1    Sig: TAKE 1 TABLET(10 MG) BY MOUTH DAILY     Cardiovascular:  Calcium Channel Blockers Failed - 02/19/2021  7:20 AM      Failed - Valid encounter within last 6 months    Recent Outpatient Visits           11 months ago Essential hypertension   [facility] [place], [place], NP   1 year ago Essential hypertension   [facility] [place], [place], NP   1 year ago Essential hypertension   [facility] [place], [place], NP   1 year ago Essential hypertension   [facility] [place], [place], NP   1 year ago Essential hypertension   [facility] [place], [place], RPH-CPP              Passed - Last BP in normal range    BP Readings from Last 1 Encounters:  02/26/20 125/81

## 2021-03-17 IMAGING — MG DIGITAL SCREENING BILAT W/ TOMO W/ CAD
8 series · 8 of 24 positions shown · non-contrast
Comparison: Previous exam(s).

CLINICAL DATA: Screening.

EXAM:
DIGITAL SCREENING BILATERAL MAMMOGRAM WITH TOMO AND CAD

[L MLO synth-2D]
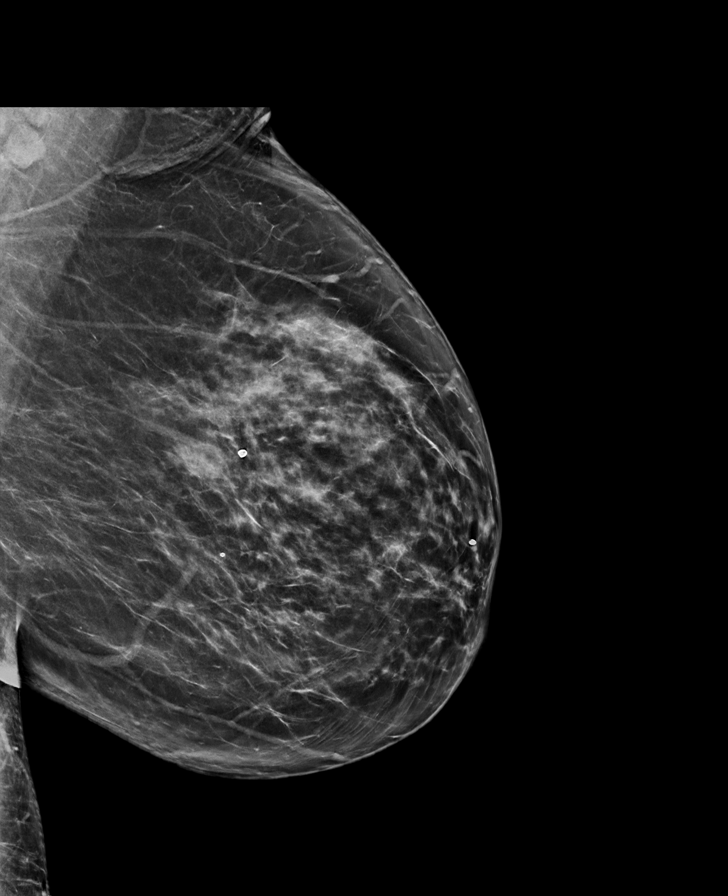

[L CC synth-2D]
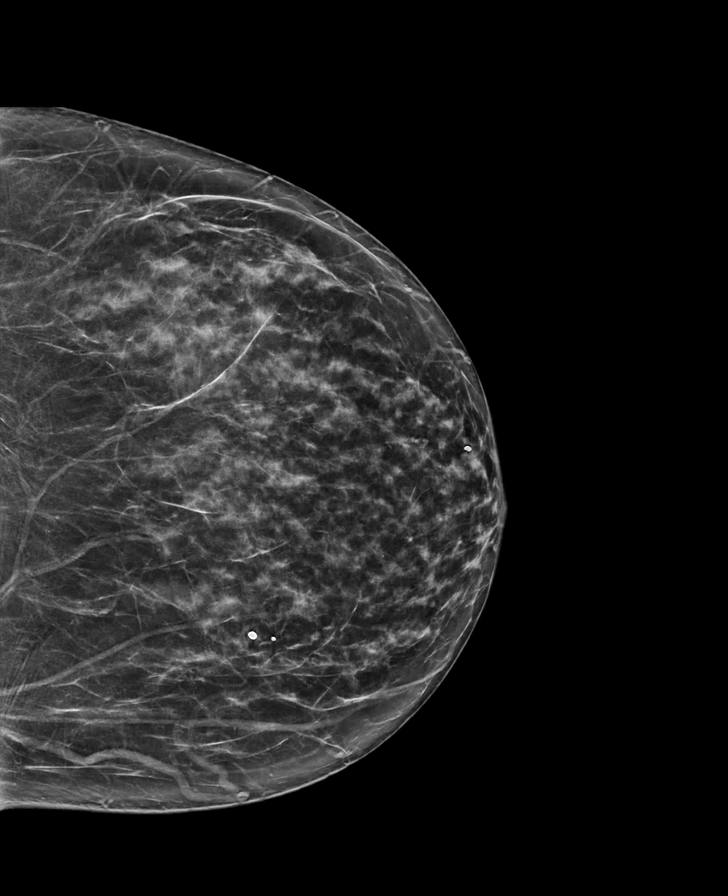

[R MLO synth-2D]
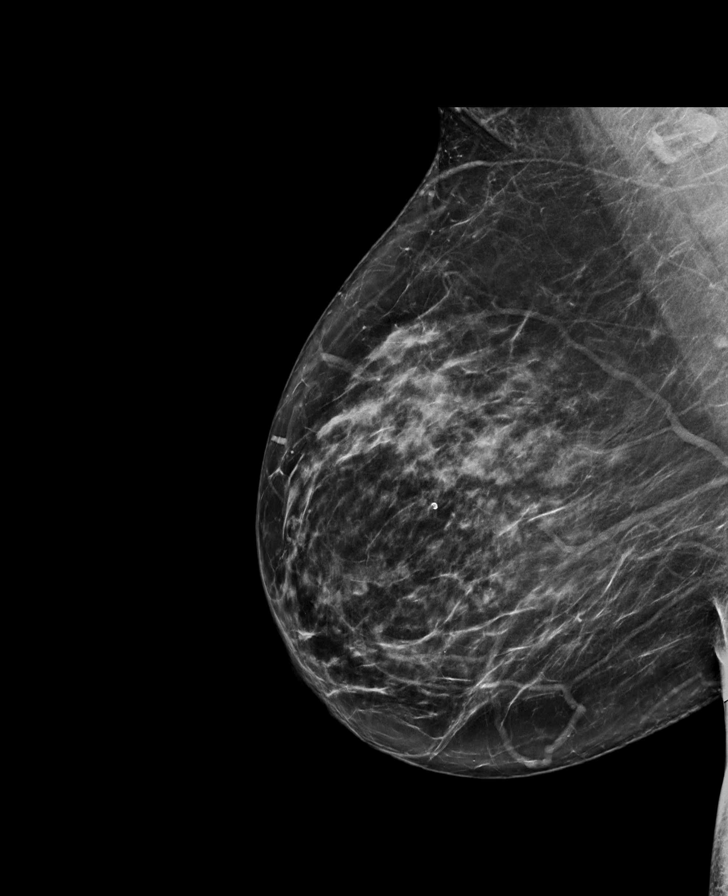

[R CC synth-2D]
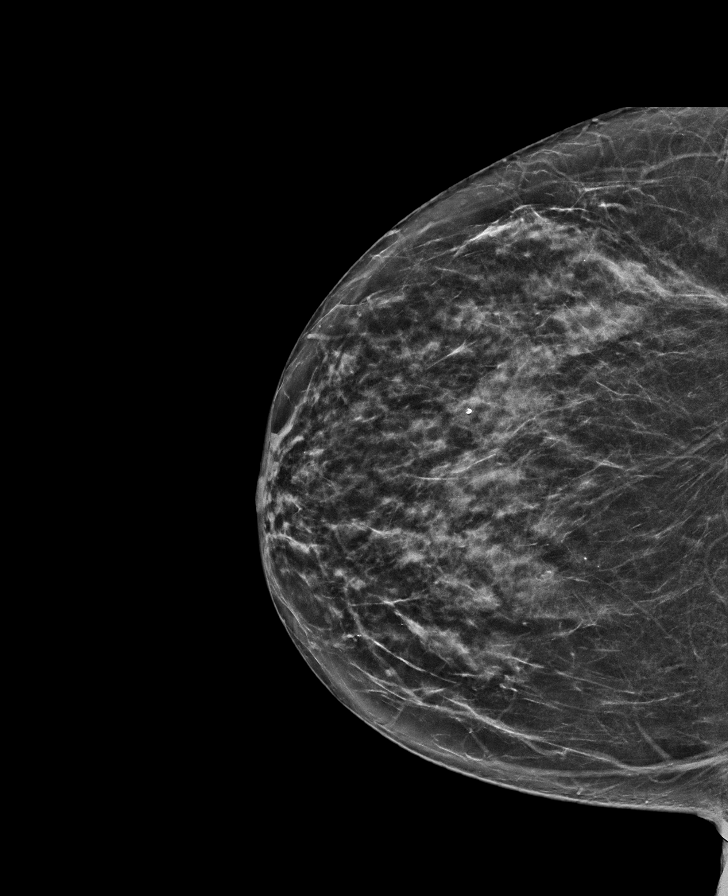

[R MLO tomo · tomo slice 41/80.0]
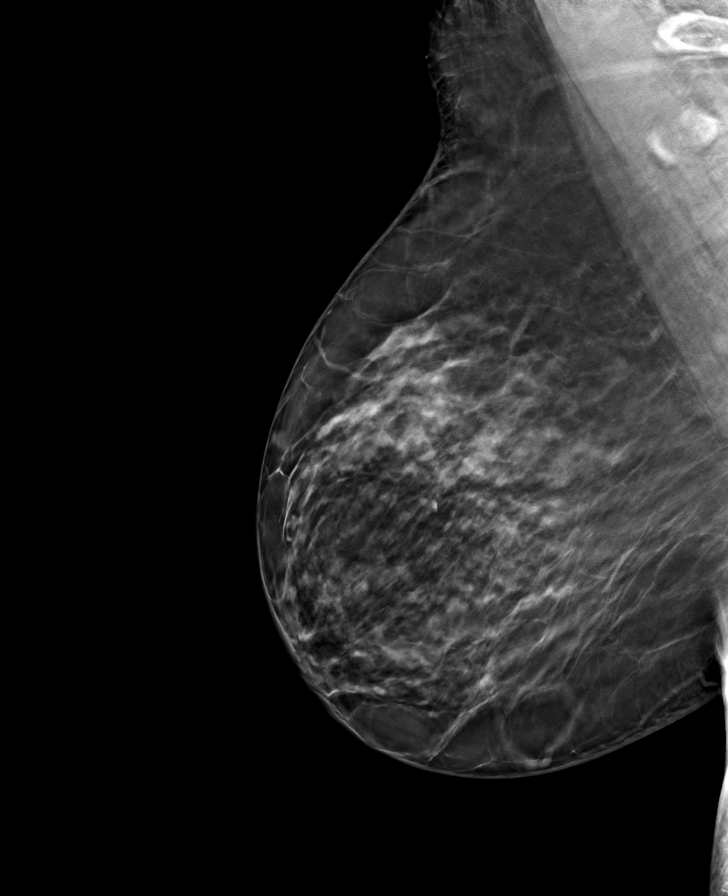

[R CC tomo · tomo slice 33/64.0]
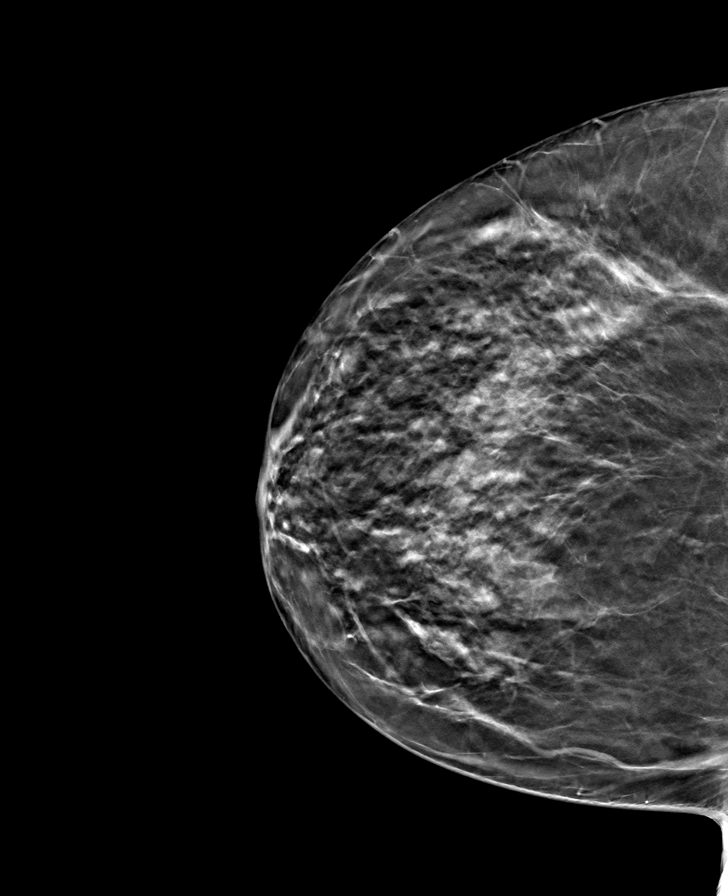

[L CC tomo · tomo slice 35/69.0]
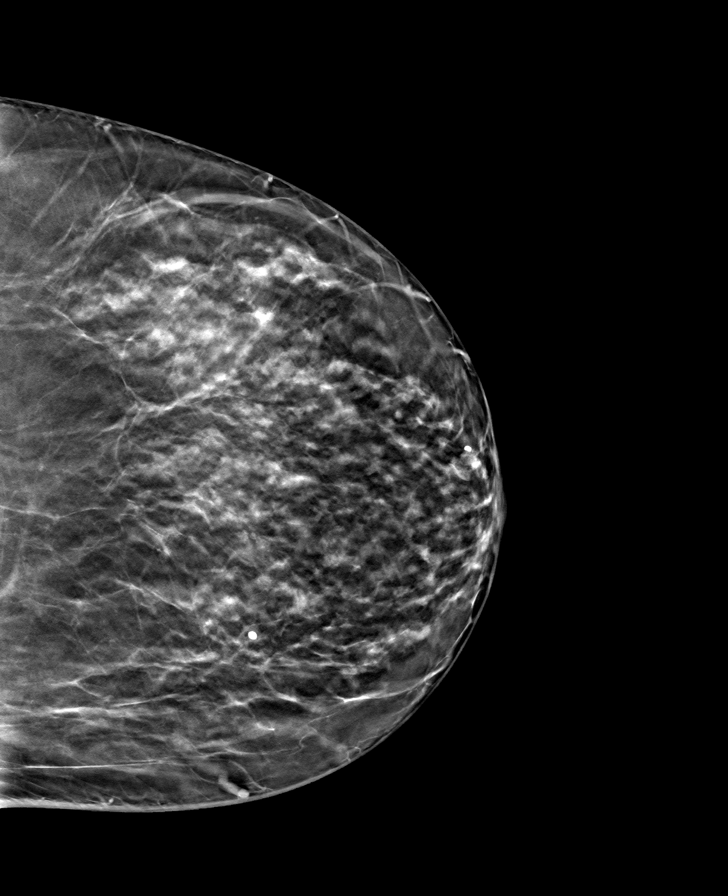

[L MLO tomo · tomo slice 43/85.0]
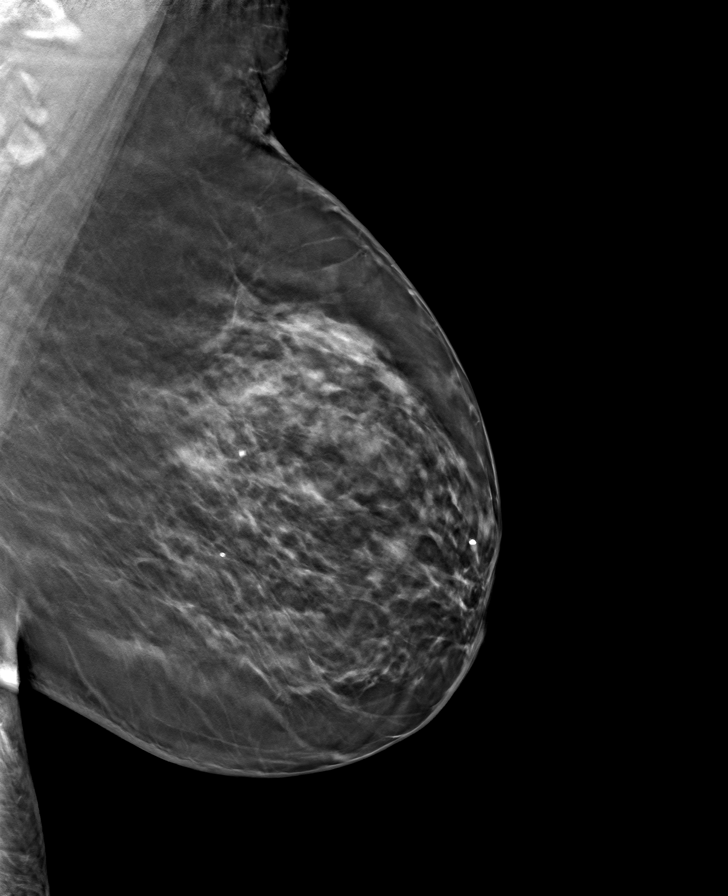

[8 of 24 positions shown; findings below may reference images not displayed]

ACR Breast Density Category c: The breast tissue is heterogeneously
dense, which may obscure small masses.
FINDINGS: There are no findings suspicious for malignancy. Images were
processed with CAD.
IMPRESSION: No mammographic evidence of malignancy. A result letter of this
screening mammogram will be mailed directly to the patient.

RECOMMENDATION:
Screening mammogram in one year. (Code:[id number])

BI-RADS CATEGORY  1: Negative.

## 2021-03-20 ENCOUNTER — Other Ambulatory Visit: Payer: Self-pay | Admitting: Nurse Practitioner

## 2021-03-20 DIAGNOSIS — E559 Vitamin D deficiency, unspecified: Secondary | ICD-10-CM

## 2021-03-20 DIAGNOSIS — I10 Essential (primary) hypertension: Secondary | ICD-10-CM

## 2021-03-20 DIAGNOSIS — E782 Mixed hyperlipidemia: Secondary | ICD-10-CM

## 2021-03-20 NOTE — Telephone Encounter (Signed)
Medication Refill - Medication: Amlodipine, potassium chloride, Vitamin D, aspirin   Has the patient contacted their pharmacy? No.  Pt states that she lost the medications and is needing to have these refilled. Please advise. (Agent: If no, request that the patient contact the pharmacy for the refill.) (Agent: If yes, when and what did the pharmacy advise?)  Preferred Pharmacy (with phone number or street name):  Has the patient been [id number] [place], [place] - [phone] AT [facility] OF [facility] & [address]  [address] [place] [address]  Phone: [phone] Fax: [phone]  Hours: Not open 24 hours   seen for an appointment in the last year OR does the patient have an upcoming appointment? Yes.    Agent: Please be advised that RX refills may take up to 3 business days. We ask that you follow-up with your pharmacy.

## 2021-03-21 ENCOUNTER — Telehealth: Payer: Self-pay | Admitting: *Deleted

## 2021-03-21 MED ORDER — ASPIRIN EC 81 MG PO TBEC
81.0000 mg | DELAYED_RELEASE_TABLET | Freq: Every day | ORAL | 0 refills | Status: DC
Start: 2021-03-21 — End: 2022-05-15

## 2021-03-21 MED ORDER — POTASSIUM CHLORIDE ER 10 MEQ PO TBCR: EXTENDED_RELEASE_TABLET | ORAL | 0 refills | Status: DC

## 2021-03-21 NOTE — Telephone Encounter (Signed)
Requested medications are on the active medication list yes  Last visit 02/26/2020, asked to return in 3 months, had a NO SHOW 6 months later  Future visit scheduled 04/06/21  Notes to clinic Vitamin D is Not Delegated, (last Vitamin D lab was 05/2019), Norvasc failed protocol due to visit longer than 6 months ago, please assess.

## 2021-03-21 NOTE — Telephone Encounter (Signed)
I called [facility] [phone] regarding the aspirin 81 mg and potassium 10 mEq.   The transmission failed so these prescriptions did not go through.   I called 3 times and got their voicemail 3 times.  I will try again later.

## 2021-03-21 NOTE — Telephone Encounter (Signed)
Requested medication (s) are due for refill today:   Yes for amlodipine  No for aspirin  Requested medication (s) are on the active medication list:   Yes for both  Future visit scheduled:   Yes in 2 wks with Sharon Seller   Last ordered: amlodipine 12/04/2019 #90, 1 refill;   aspirin 03/21/2021 #90, 0 refills duplicate request for the aspirin came in.    Had courtesy refills already.   Provider to review for refills prior to upcoming appt.   Requested Prescriptions  Pending Prescriptions Disp Refills   amLODipine (NORVASC) 10 MG tablet 90 tablet 1    Sig: TAKE 1 TABLET(10 MG) BY MOUTH DAILY     Cardiovascular:  Calcium Channel Blockers Failed - 03/21/2021  1:08 PM      Failed - Valid encounter within last 6 months    Recent Outpatient Visits           1 year ago Essential hypertension   [facility] [place], Shea Stakes, NP   1 year ago Essential hypertension   [facility] [place], Shea Stakes, NP   1 year ago Essential hypertension   [facility] [place], Shea Stakes, NP   1 year ago Essential hypertension   [facility] [place], Shea Stakes, NP   2 years ago Essential hypertension   [facility] Lo[place] Hux[place], Cornelius Moras, RPH-CPP       Future Appointments             In 2 weeks Anders Simmonds, PA-C [facility]            Passed - Last BP in normal range    BP Readings from Last 1 Encounters:  02/26/20 125/81           Vitamin D, Ergocalciferol, (DRISDOL) 1.25 MG (50000 UNIT) CAPS capsule 16 capsule 1    Sig: Take 1 capsule (50,000 Units total) by mouth every 7 (seven) days. 90 day supply     Endocrinology:  Vitamins - Vitamin D Supplementation Failed - 03/21/2021  1:08 PM      Failed - 50,000 IU strengths are not delegated      Failed - Phosphate in normal range and within 360 days    No results found for:  PHOS        Failed - Vitamin D in normal range and within 360 days    Vit D, 25-Hydroxy  Date Value Ref Range Status  05/28/2019 23.6 (L) 30.0 - 100.0 ng/mL Final    Comment:    Vitamin D deficiency has been defined by the Institute of Medicine and an Endocrine Society practice guideline as a level of serum 25-OH vitamin D less than 20 ng/mL (1,2). The Endocrine Society went on to further define vitamin D insufficiency as a level between 21 and 29 ng/mL (2). 1. IOM (Institute of Medicine). 2010. Dietary reference    intakes for calcium and D. [place]: The    [employer]. 2. Holick MF, Binkley [place], Bischoff-Ferrari HA, et al.    Evaluation, treatment, and prevention of vitamin D    deficiency: an Endocrine Society clinical practice    guideline. JCEM. 2011 Jul; 96(7):1911-30.           Failed - Valid encounter within last 12 months    Recent Outpatient Visits  1 year ago Essential hypertension   [facility] [place], Shea Stakes, NP   1 year ago Essential hypertension   [facility] [place], Shea Stakes, NP   1 year ago Essential hypertension   [facility] [place], Shea Stakes, NP   1 year ago Essential hypertension   [facility] [place], Shea Stakes, NP   2 years ago Essential hypertension   [facility] [place] Chalet, RPH-CPP       Future Appointments             In 2 weeks Anders Simmonds, PA-C [facility]            Passed - Ca in normal range and within 360 days    Calcium  Date Value Ref Range Status  07/26/2020 9.4 8.7 - 10.3 mg/dL Final   Calcium, Ion  Date Value Ref Range Status  04/07/2013 1.15 1.13 - 1.30 mmol/L Final          Signed Prescriptions Disp Refills   potassium chloride (KLOR-CON) 10 MEQ tablet 90 tablet 0    Sig: TAKE 1 TABLET(10 MEQ)  BY MOUTH DAILY     Endocrinology:  Minerals - Potassium Supplementation Failed - 03/20/2021  3:39 PM      Failed - Cr in normal range and within 360 days    Creat  Date Value Ref Range Status  06/14/2016 1.04 (H) 0.50 - 0.99 mg/dL Final    Comment:      For patients > or = 70 years of age: The upper reference limit for Creatinine is approximately 13% higher for people identified as African-American.      Creatinine, Ser  Date Value Ref Range Status  07/26/2020 1.05 (H) 0.57 - 1.00 mg/dL Final   Creatinine, POC  Date Value Ref Range Status  04/05/2017 300 mg/dL Final   Creatinine, Urine  Date Value Ref Range Status  06/14/2016 273 20 - 320 mg/dL Final          Failed - Valid encounter within last 12 months    Recent Outpatient Visits           1 year ago Essential hypertension   [facility] [place], Shea Stakes, NP   1 year ago Essential hypertension   [facility] [place], Shea Stakes, NP   1 year ago Essential hypertension   [facility] [place], [place] W, NP   1 year ago Essential hypertension   [facility] [place], [place], NP   2 years ago Essential hypertension   [facility] Drucilla Chalet, RPH-CPP       Future Appointments             In 2 weeks Anders Simmonds, PA-C [place] [facility]            Passed - K in normal range and within 360 days    Potassium  Date Value Ref Range Status  07/26/2020 3.8 3.5 - 5.2 mmol/L Final           aspirin EC 81 MG tablet 90 tablet 0    Sig: Take 1 tablet (81 mg total) by mouth daily.     Analgesics:  NSAIDS - aspirin Failed - 03/20/2021  3:39 PM      Failed - Valid encounter within last 12 months    Recent Outpatient Visits           1 year ago Essential hypertension   [facility] [place], Shea Stakes, NP   1  year ago Essential hypertension   [facility] [place], Shea Stakes, NP   1 year ago Essential hypertension   [facility] [place], Shea Stakes, NP   1 year ago Essential hypertension   [facility] [place], Shea Stakes, NP   2 years ago Essential hypertension   [facility] Lois Hux[place], Cornelius Moras, RPH-CPP       Future Appointments             In 2 weeks Sharon Seller [place] [facility] [place] [facility]            Passed - Patient is not pregnant

## 2021-03-21 NOTE — Telephone Encounter (Signed)
I called [facility] [phone] 3 times to let them know the prescriptions for the aspirin 81 mg and potassium 10 mEq did not go through.   The transmission failed.   I kept getting the voicemail for the pharmacy.   Never could get through to a person.    Will try again later.

## 2021-03-23 ENCOUNTER — Other Ambulatory Visit: Payer: Self-pay | Admitting: Family Medicine

## 2021-03-23 DIAGNOSIS — I10 Essential (primary) hypertension: Secondary | ICD-10-CM

## 2021-03-23 MED ORDER — AMLODIPINE BESYLATE 10 MG PO TABS: ORAL_TABLET | ORAL | 0 refills | Status: DC

## 2021-03-23 NOTE — Telephone Encounter (Signed)
Requested medication (s) are due for refill today: requesting 90 day supply  Requested medication (s) are on the active medication list: yes  Last refill:  03/23/21 #30 0 refills  Future visit scheduled: yes in 2 weeks   Notes to clinic:  last encounter 1 year ago . Patient requesting 90 day supply     Requested Prescriptions  Pending Prescriptions Disp Refills   amLODipine (NORVASC) 10 MG tablet [Pharmacy Med Name: AMLODIPINE BESYLATE 10MG  TABLETS] 90 tablet     Sig: TAKE 1 TABLET(10 MG) BY MOUTH DAILY     Cardiovascular:  Calcium Channel Blockers Failed - 03/23/2021  2:20 PM      Failed - Valid encounter within last 6 months    Recent Outpatient Visits           1 year ago Essential hypertension   [facility] [place], [place], NP   1 year ago Essential hypertension   [facility] [place], [place], NP   1 year ago Essential hypertension   [facility] [place], [place], NP   1 year ago Essential hypertension   [facility] [place], [place], NP   2 years ago Essential hypertension   [facility], Lois Huxley, RPH-CPP       Future Appointments             In 2 weeks Cornelius Moras, PA-C  [facility]            Passed - Last BP in normal range    BP Readings from Last 1 Encounters:  02/26/20 125/81

## 2021-04-06 ENCOUNTER — Ambulatory Visit: Payer: Medicare Other | Admitting: Physician Assistant

## 2021-04-07 ENCOUNTER — Ambulatory Visit: Payer: Medicare Other | Attending: Nurse Practitioner | Admitting: Pharmacist

## 2021-04-07 ENCOUNTER — Other Ambulatory Visit: Payer: Self-pay

## 2021-04-07 DIAGNOSIS — Z23 Encounter for immunization: Secondary | ICD-10-CM | POA: Diagnosis not present

## 2021-04-07 NOTE — Progress Notes (Signed)
Patient presents for vaccination against influenza per orders of Zelda. Consent given. Counseling provided. No contraindications exists. Vaccine administered without incident.   Luke Van Ausdall, PharmD, BCACP, CPP Clinical Pharmacist [facility] [phone]  

## 2021-05-19 ENCOUNTER — Ambulatory Visit: Payer: Self-pay | Admitting: *Deleted

## 2021-05-19 NOTE — Telephone Encounter (Signed)
Patient called, left VM to return the call to the office to discuss with a nurse.   Message from Darron Doom sent at 05/19/2021 11:51 AM EST  Patient called in to get an appointment to be seen for medication refill. She stated that she have misplaced her medication but did not say when last visit to office was September of 2021. Please advise Ph# [phone]

## 2021-05-19 NOTE — Telephone Encounter (Signed)
Patient called in to get an appointment to be seen for medication refill. She stated that she have misplaced her medication but did not say when last visit to office was September of 2021. Please advise Ph# [phone]   Attempted to call patient- left message to call office.

## 2021-05-20 NOTE — Telephone Encounter (Signed)
3 rd attempt to contact patient to review symptoms and medication refill requests. Patient reminded of appt scheduled for 05/23/21. No answer, left message to call clinic back at #[phone].

## 2021-05-22 ENCOUNTER — Encounter: Payer: Self-pay | Admitting: Physician Assistant

## 2021-05-22 ENCOUNTER — Other Ambulatory Visit: Payer: Self-pay

## 2021-05-22 ENCOUNTER — Ambulatory Visit: Payer: Medicare Other | Attending: Physician Assistant | Admitting: Physician Assistant

## 2021-05-22 ENCOUNTER — Other Ambulatory Visit: Payer: Self-pay | Admitting: Nurse Practitioner

## 2021-05-22 VITALS — BP 144/90 | HR 67 | Temp 98.7°F | Resp 18 | Ht 64.0 in | Wt 187.0 lb

## 2021-05-22 DIAGNOSIS — F17200 Nicotine dependence, unspecified, uncomplicated: Secondary | ICD-10-CM | POA: Diagnosis not present

## 2021-05-22 DIAGNOSIS — Z1231 Encounter for screening mammogram for malignant neoplasm of breast: Secondary | ICD-10-CM | POA: Diagnosis not present

## 2021-05-22 DIAGNOSIS — Z23 Encounter for immunization: Secondary | ICD-10-CM

## 2021-05-22 DIAGNOSIS — K219 Gastro-esophageal reflux disease without esophagitis: Secondary | ICD-10-CM | POA: Diagnosis not present

## 2021-05-22 DIAGNOSIS — I10 Essential (primary) hypertension: Secondary | ICD-10-CM

## 2021-05-22 DIAGNOSIS — R7303 Prediabetes: Secondary | ICD-10-CM | POA: Diagnosis not present

## 2021-05-22 DIAGNOSIS — E559 Vitamin D deficiency, unspecified: Secondary | ICD-10-CM

## 2021-05-22 DIAGNOSIS — Z122 Encounter for screening for malignant neoplasm of respiratory organs: Secondary | ICD-10-CM | POA: Diagnosis not present

## 2021-05-22 DIAGNOSIS — E782 Mixed hyperlipidemia: Secondary | ICD-10-CM | POA: Diagnosis not present

## 2021-05-22 DIAGNOSIS — E6609 Other obesity due to excess calories: Secondary | ICD-10-CM

## 2021-05-22 DIAGNOSIS — Z6832 Body mass index (BMI) 32.0-32.9, adult: Secondary | ICD-10-CM

## 2021-05-22 LAB — POCT GLYCOSYLATED HEMOGLOBIN (HGB A1C): Hemoglobin A1C: 5.8 % — AB (ref 4.0–5.6)

## 2021-05-22 MED ORDER — ROSUVASTATIN CALCIUM 20 MG PO TABS: ORAL_TABLET | ORAL | 3 refills | Status: DC

## 2021-05-22 MED ORDER — AMLODIPINE BESYLATE 10 MG PO TABS: ORAL_TABLET | ORAL | 1 refills | Status: DC

## 2021-05-22 NOTE — Patient Instructions (Signed)
You will resume taking your medications.  I do encourage you to check your blood pressure at home at least several times a week, keep a written log and have available for all office visits.  We will call you with today's lab results.  Roney Jaffe, PA-C Physician Assistant [facility] [email or web address]  Health Maintenance, Female Adopting a healthy lifestyle and getting preventive care are important in promoting health and wellness. Ask your health care provider about: The right schedule for you to have regular tests and exams. Things you can do on your own to prevent diseases and keep yourself healthy. What should I know about diet, weight, and exercise? Eat a healthy diet  Eat a diet that includes plenty of vegetables, fruits, low-fat dairy products, and lean protein. Do not eat a lot of foods that are high in solid fats, added sugars, or sodium. Maintain a healthy weight Body mass index (BMI) is used to identify weight problems. It estimates body fat based on height and weight. Your health care provider can help determine your BMI and help you achieve or maintain a healthy weight. Get regular exercise Get regular exercise. This is one of the most important things you can do for your health. Most adults should: Exercise for at least 150 minutes each week. The exercise should increase your heart rate and make you sweat (moderate-intensity exercise). Do strengthening exercises at least twice a week. This is in addition to the moderate-intensity exercise. Spend less time sitting. Even light physical activity can be beneficial. Watch cholesterol and blood lipids Have your blood tested for lipids and cholesterol at 71 years of age, then have this test every 5 years. Have your cholesterol levels checked more often if: Your lipid or cholesterol levels are high. You are older than 71 years of age. You are at high risk for heart  disease. What should I know about cancer screening? Depending on your health history and family history, you may need to have cancer screening at various ages. This may include screening for: Breast cancer. Cervical cancer. Colorectal cancer. Skin cancer. Lung cancer. What should I know about heart disease, diabetes, and high blood pressure? Blood pressure and heart disease High blood pressure causes heart disease and increases the risk of stroke. This is more likely to develop in people who have high blood pressure readings or are overweight. Have your blood pressure checked: Every 3-5 years if you are 21-23 years of age. Every year if you are 23 years old or older. Diabetes Have regular diabetes screenings. This checks your fasting blood sugar level. Have the screening done: Once every three years after age 63 if you are at a normal weight and have a low risk for diabetes. More often and at a younger age if you are overweight or have a high risk for diabetes. What should I know about preventing infection? Hepatitis B If you have a higher risk for hepatitis B, you should be screened for this virus. Talk with your health care provider to find out if you are at risk for hepatitis B infection. Hepatitis C Testing is recommended for: Everyone born from [birth year] through 1966. Anyone with known risk factors for hepatitis C. Sexually transmitted infections (STIs) Get screened for STIs, including gonorrhea and chlamydia, if: You are sexually active and are younger than 71 years of age. You are older than 71 years of age and your health care provider tells you that you are at risk for this type of infection.  Your sexual activity has changed since you were last screened, and you are at increased risk for chlamydia or gonorrhea. Ask your health care provider if you are at risk. Ask your health care provider about whether you are at high risk for HIV. Your health care provider may recommend a  prescription medicine to help prevent HIV infection. If you choose to take medicine to prevent HIV, you should first get tested for HIV. You should then be tested every 3 months for as long as you are taking the medicine. Pregnancy If you are about to stop having your period (premenopausal) and you may become pregnant, seek counseling before you get pregnant. Take 400 to 800 micrograms (mcg) of folic acid every day if you become pregnant. Ask for birth control (contraception) if you want to prevent pregnancy. Osteoporosis and menopause Osteoporosis is a disease in which the bones lose minerals and strength with aging. This can result in bone fractures. If you are 23 years old or older, or if you are at risk for osteoporosis and fractures, ask your health care provider if you should: Be screened for bone loss. Take a calcium or vitamin D supplement to lower your risk of fractures. Be given hormone replacement therapy (HRT) to treat symptoms of menopause. Follow these instructions at home: Alcohol use Do not drink alcohol if: Your health care provider tells you not to drink. You are pregnant, may be pregnant, or are planning to become pregnant. If you drink alcohol: Limit how much you have to: 0-1 drink a day. Know how much alcohol is in your drink. In the [place] one drink equals one 12 oz bottle of beer (355 mL), one 5 oz glass of wine (148 mL), or one 1 oz glass of hard liquor (44 mL). Lifestyle Do not use any products that contain nicotine or tobacco. These products include cigarettes, chewing tobacco, and vaping devices, such as e-cigarettes. If you need help quitting, ask your health care provider. Do not use street drugs. Do not share needles. Ask your health care provider for help if you need support or information about quitting drugs. General instructions Schedule regular health, dental, and eye exams. Stay current with your vaccines. Tell your health care provider if: You often  feel depressed. You have ever been abused or do not feel safe at home. Summary Adopting a healthy lifestyle and getting preventive care are important in promoting health and wellness. Follow your health care provider's instructions about healthy diet, exercising, and getting tested or screened for diseases. Follow your health care provider's instructions on monitoring your cholesterol and blood pressure. This information is not intended to replace advice given to you by your health care provider. Make sure you discuss any questions you have with your health care provider. Document Revised: 10/31/2020 Document Reviewed: 10/31/2020 Elsevier Patient Education  2022 [employer].

## 2021-05-22 NOTE — Progress Notes (Signed)
Patient has eaten today. Patient has been out of medication for 3 weeks. Patient denies pain at this time.

## 2021-05-22 NOTE — Progress Notes (Signed)
Established Patient Office Visit  Subjective:  Patient ID: Rebecca Pope, female    DOB: 1950-11-23  Age: 70 y.o. MRN: [id number]  CC:  Chief Complaint  Patient presents with   Medication Refill    HPI [facility] presents for [facility] refills, last OV was 02/2020.  States that she has been out of her medications for the past 3 weeks.  Reports that she does check her blood pressure at home on occasion, states that when she was taking her medication on a daily basis, states that her blood pressure was "normal", states the readings are similar to today's.  Reports that she has not been taking the omeprazole "for some time" denies any heartburn or acid reflux symptoms.  Reports that she is currently smoking approximately half a pack of cigarettes a day, states that she used to smoke a pack a day for the past 30 years.  No other concerns at this time Past Medical History:  Diagnosis Date   GERD (gastroesophageal reflux disease)    Hyperlipidemia    Hypertension    Osteopenia determined by x-ray     Past Surgical History:  Procedure Laterality Date   APPENDECTOMY      Family History  Problem Relation Age of Onset   Cancer Mother    Hypertension Mother    Cancer Father     Social History   Socioeconomic History   Marital status: Single    Spouse name: Not on file   Number of children: Not on file   Years of education: Not on file   Highest education level: Not on file  Occupational History   Not on file  Tobacco Use   Smoking status: Every Day    Packs/day: 1.00    Types: Cigarettes   Smokeless tobacco: Never  Vaping Use   Vaping Use: Never used  Substance and Sexual Activity   Alcohol use: No   Drug use: No   Sexual activity: Not Currently  Other Topics Concern   Not on file  Social History Narrative   Not on file   Social Determinants of Health   Financial Resource Strain: Not on file  Food Insecurity: Not on file  Transportation Needs: Not  on file  Physical Activity: Not on file  Stress: Not on file  Social Connections: Not on file  Intimate Partner Violence: Not on file    Outpatient Medications Prior to Visit  Medication Sig Dispense Refill   aspirin EC 81 MG tablet Take 1 tablet (81 mg total) by mouth daily. 90 tablet 0   omeprazole (PRILOSEC) 20 MG capsule Take 1 capsule (20 mg total) by mouth daily. 90 capsule 1   amLODipine (NORVASC) 10 MG tablet TAKE 1 TABLET(10 MG) BY MOUTH DAILY 30 tablet 0   rosuvastatin (CRESTOR) 20 MG tablet TAKE 1 TABLET(20 MG) BY MOUTH DAILY 90 tablet 3   Vitamin D, Ergocalciferol, (DRISDOL) 1.25 MG (50000 UNIT) CAPS capsule Take 1 capsule (50,000 Units total) by mouth every 7 (seven) days. 90 day supply 16 capsule 1   potassium chloride (KLOR-CON) 10 MEQ tablet TAKE 1 TABLET(10 MEQ) BY MOUTH DAILY 90 tablet 0   No facility-administered medications prior to visit.    No Known Allergies  ROS Review of Systems  Constitutional: Negative.   HENT: Negative.    Eyes: Negative.   Respiratory:  Negative for shortness of breath.   Cardiovascular:  Negative for chest pain.  Gastrointestinal: Negative.   Endocrine: Negative.  Genitourinary: Negative.   Musculoskeletal: Negative.   Skin: Negative.   Allergic/Immunologic: Negative.   Neurological: Negative.   Hematological: Negative.   Psychiatric/Behavioral: Negative.       Objective:    Physical Exam Vitals and nursing note reviewed.  Constitutional:      Appearance: Normal appearance.  HENT:     Head: Normocephalic and atraumatic.     Right Ear: External ear normal.     Left Ear: External ear normal.     Nose: Nose normal.     Mouth/Throat:     Mouth: Mucous membranes are moist.     Pharynx: Oropharynx is clear.  Eyes:     Extraocular Movements: Extraocular movements intact.     Conjunctiva/sclera: Conjunctivae normal.     Pupils: Pupils are equal, round, and reactive to light.  Cardiovascular:     Rate and Rhythm: Normal  rate and regular rhythm.     Pulses: Normal pulses.     Heart sounds: Normal heart sounds.  Pulmonary:     Effort: Pulmonary effort is normal.     Breath sounds: Normal breath sounds.  Musculoskeletal:        General: Normal range of motion.     Cervical back: Normal range of motion and neck supple.  Skin:    General: Skin is warm and dry.  Neurological:     General: No focal deficit present.     Mental Status: She is alert and oriented to person, place, and time.  Psychiatric:        Mood and Affect: Mood normal.        Behavior: Behavior normal.        Thought Content: Thought content normal.        Judgment: Judgment normal.    BP (!) 144/90 (BP Location: Left Arm, Patient Position: Sitting, Cuff Size: Normal)   Pulse 67   Temp 98.7 F (37.1 C) (Oral)   Resp 18   Ht 5\' 4"  (1.626 m)   Wt 187 lb (84.8 kg)   SpO2 95%   BMI 32.10 kg/m  Wt Readings from Last 3 Encounters:  05/22/21 187 lb (84.8 kg)  02/26/20 188 lb (85.3 kg)  08/27/19 194 lb (88 kg)     Health Maintenance Due  Topic Date Due   COVID-19 Vaccine (1) Never done   Zoster Vaccines- Shingrix (1 of 2) Never done   COLON CANCER SCREENING ANNUAL FOBT  09/23/2020    There are no preventive care reminders to display for this patient.  Lab Results  Component Value Date   TSH 0.691 11/28/2017   Lab Results  Component Value Date   WBC 5.1 02/26/2020   HGB 13.8 02/26/2020   HCT 42.7 02/26/2020   MCV 80 02/26/2020   PLT 208 02/26/2020   Lab Results  Component Value Date   NA 144 07/26/2020   K 3.8 07/26/2020   CO2 27 07/26/2020   GLUCOSE 132 (H) 07/26/2020   BUN 18 07/26/2020   CREATININE 1.05 (H) 07/26/2020   BILITOT <0.2 07/26/2020   ALKPHOS 83 07/26/2020   AST 16 07/26/2020   ALT 9 07/26/2020   PROT 7.5 07/26/2020   ALBUMIN 4.1 07/26/2020   CALCIUM 9.4 07/26/2020   Lab Results  Component Value Date   CHOL 193 07/26/2020   Lab Results  Component Value Date   HDL 49 07/26/2020   Lab  Results  Component Value Date   LDLCALC 123 (H) 07/26/2020   Lab Results  Component Value Date   TRIG 119 07/26/2020   Lab Results  Component Value Date   CHOLHDL 3.9 07/26/2020   Lab Results  Component Value Date   HGBA1C 5.8 (A) 05/22/2021      Assessment & Plan:   Problem List Items Addressed This Visit   None Visit Diagnoses     Prediabetes    -  Primary   Relevant Orders   POCT A1C (Completed)   Essential hypertension       Relevant Medications   amLODipine (NORVASC) 10 MG tablet   rosuvastatin (CRESTOR) 20 MG tablet   Other Relevant Orders   CBC with Differential/Platelet   Comp. Metabolic Panel (12)   Vitamin D deficiency       Relevant Orders   Vitamin D, 25-hydroxy   Mixed hyperlipidemia       Relevant Medications   amLODipine (NORVASC) 10 MG tablet   rosuvastatin (CRESTOR) 20 MG tablet   Gastroesophageal reflux disease, unspecified whether esophagitis present       Breast cancer screening by mammogram       Relevant Orders   MM DIGITAL SCREENING BILATERAL   Tobacco dependence       Relevant Orders   CT CHEST LUNG CA SCREEN LOW DOSE W/O CM   Need for shingles vaccine       Relevant Orders   Varicella-zoster vaccine subcutaneous   Encounter for screening for lung cancer       Relevant Orders   CT CHEST LUNG CA SCREEN LOW DOSE W/O CM       Meds ordered this encounter  Medications   amLODipine (NORVASC) 10 MG tablet    Sig: TAKE 1 TABLET(10 MG) BY MOUTH DAILY    Dispense:  90 tablet    Refill:  1    Must have office visit for refills    Order Specific Question:   Supervising Provider    Answer:   Storm Frisk [[id number]]   rosuvastatin (CRESTOR) 20 MG tablet    Sig: TAKE 1 TABLET(20 MG) BY MOUTH DAILY    Dispense:  90 tablet    Refill:  3    Order Specific Question:   Supervising Provider    Answer:   WRIGHT, PATRICK E [[id number]]   1. Essential hypertension Resume amlodipine.  Patient encouraged to check blood pressure at home on a daily  basis, keep a written log and have available for all office visits.  Red flags given for prompt reevaluation. - CBC with Differential/Platelet - Comp. Metabolic Panel (12) - amLODipine (NORVASC) 10 MG tablet; TAKE 1 TABLET(10 MG) BY MOUTH DAILY  Dispense: 90 tablet; Refill: 1  2. Mixed hyperlipidemia Resume rosuvastatin. - rosuvastatin (CRESTOR) 20 MG tablet; TAKE 1 TABLET(20 MG) BY MOUTH DAILY  Dispense: 90 tablet; Refill: 3  3. Prediabetes A1c 5.8.  Patient education given on lifestyle modifications - POCT A1C  4. Vitamin D deficiency  - Vitamin D, 25-hydroxy  5. Gastroesophageal reflux disease, unspecified whether esophagitis present Continue lifestyle modifications  6. Tobacco dependence Patient education given on smoking cessation - CT CHEST LUNG CA SCREEN LOW DOSE W/O CM; Future  7. Breast cancer screening by mammogram  - MM DIGITAL SCREENING BILATERAL; Future  8. Need for shingles vaccine  - Varicella-zoster vaccine subcutaneous; Future  9. Class 1 obesity due to excess calories with serious comorbidity and body mass index (BMI) of 32.0 to 32.9 in adult    I have reviewed the patient's medical history (PMH, PSH, Social  History, Family History, Medications, and allergies) , and have been updated if relevant. I spent 30 minutes reviewing chart and  face to face time with patient.    Follow-up: Return in about 3 months (around 08/22/2021) for with Zelda .    Kasandra Knudsen Mayers, PA-C

## 2021-05-23 LAB — COMP. METABOLIC PANEL (12)
AST: 14 IU/L (ref 0–40)
Albumin/Globulin Ratio: 1.4 (ref 1.2–2.2)
Albumin: 4 g/dL (ref 3.7–4.7)
Alkaline Phosphatase: 87 IU/L (ref 44–121)
BUN/Creatinine Ratio: 11 — ABNORMAL LOW (ref 12–28)
BUN: 11 mg/dL (ref 8–27)
Bilirubin Total: <0.2 mg/dL (ref 0.0–1.2)
Calcium: 9.4 mg/dL (ref 8.7–10.3)
Chloride: 105 mmol/L (ref 96–106)
Creatinine, Ser: 1.02 mg/dL — ABNORMAL HIGH (ref 0.57–1.00)
Globulin, Total: 2.9 g/dL (ref 1.5–4.5)
Glucose: 103 mg/dL — ABNORMAL HIGH (ref 70–99)
Potassium: 3.8 mmol/L (ref 3.5–5.2)
Sodium: 144 mmol/L (ref 134–144)
Total Protein: 6.9 g/dL (ref 6.0–8.5)
eGFR: 59 mL/min/{1.73_m2} — ABNORMAL LOW (ref >59)

## 2021-05-23 LAB — CBC WITH DIFFERENTIAL/PLATELET
Basophils Absolute: 0 10*3/uL (ref 0.0–0.2)
Basos: 1 %
EOS (ABSOLUTE): 0.2 10*3/uL (ref 0.0–0.4)
Eos: 3 %
Hematocrit: 42.3 % (ref 34.0–46.6)
Hemoglobin: 13.3 g/dL (ref 11.1–15.9)
Immature Grans (Abs): 0 10*3/uL (ref 0.0–0.1)
Immature Granulocytes: 0 %
Lymphocytes Absolute: 2.3 10*3/uL (ref 0.7–3.1)
Lymphs: 43 %
MCH: 24.5 pg — ABNORMAL LOW (ref 26.6–33.0)
MCHC: 31.4 g/dL — ABNORMAL LOW (ref 31.5–35.7)
MCV: 78 fL — ABNORMAL LOW (ref 79–97)
Monocytes Absolute: 0.5 10*3/uL (ref 0.1–0.9)
Monocytes: 8 %
Neutrophils Absolute: 2.4 10*3/uL (ref 1.4–7.0)
Neutrophils: 45 %
Platelets: 217 10*3/uL (ref 150–450)
RBC: 5.43 x10E6/uL — ABNORMAL HIGH (ref 3.77–5.28)
RDW: 15 % (ref 11.7–15.4)
WBC: 5.3 10*3/uL (ref 3.4–10.8)

## 2021-05-23 LAB — VITAMIN D 25 HYDROXY (VIT D DEFICIENCY, FRACTURES): Vit D, 25-Hydroxy: 18.6 ng/mL — ABNORMAL LOW (ref 30.0–100.0)

## 2021-05-23 MED ORDER — VITAMIN D (ERGOCALCIFEROL) 1.25 MG (50000 UNIT) PO CAPS: 50000.0000 [IU] | ORAL_CAPSULE | ORAL | 2 refills | Status: DC

## 2021-05-23 NOTE — Addendum Note (Signed)
Addended by: Roney Jaffe on: 05/23/2021 01:20 PM   Modules accepted: Orders

## 2021-05-30 ENCOUNTER — Telehealth: Payer: Self-pay | Admitting: *Deleted

## 2021-05-30 NOTE — Telephone Encounter (Signed)
-----   Message from Roney Jaffe, [place] sent at 05/23/2021  1:20 PM EST ----- Please call patient and let her know that her liver function is within normal limits, her kidney function is slightly decreased, I do encourage her to increase her water intake.  She does not show signs of anemia.  Her vitamin D level is low, she needs to take 50,000 units once a week for the next 12 weeks and have it rechecked at that time.  Prescription sent to her pharmacy.

## 2021-05-30 NOTE — Telephone Encounter (Signed)
Patient is aware of results via VM per signed DPR.

## 2021-06-05 ENCOUNTER — Ambulatory Visit (HOSPITAL_COMMUNITY): Payer: Medicare Other

## 2021-07-04 ENCOUNTER — Ambulatory Visit
Admission: RE | Admit: 2021-07-04 | Discharge: 2021-07-04 | Disposition: A | Discharge status: Home / Self Care | Payer: Medicare Other | Source: Ambulatory Visit | Attending: Physician Assistant | Admitting: Physician Assistant

## 2021-07-04 ENCOUNTER — Other Ambulatory Visit: Payer: Self-pay

## 2021-07-04 DIAGNOSIS — Z1231 Encounter for screening mammogram for malignant neoplasm of breast: Secondary | ICD-10-CM

## 2021-07-06 ENCOUNTER — Other Ambulatory Visit: Payer: Self-pay | Admitting: Physician Assistant

## 2021-07-06 DIAGNOSIS — R928 Other abnormal and inconclusive findings on diagnostic imaging of breast: Secondary | ICD-10-CM

## 2021-07-11 ENCOUNTER — Telehealth: Payer: Self-pay | Admitting: *Deleted

## 2021-07-11 NOTE — Telephone Encounter (Signed)
Patient verified DOB Patient is aware asymmetry being noted on her left breast and needing to be scheduled for diagnostic and [place] of breast. Patient provided Breast center contact to schedule.

## 2021-07-12 ENCOUNTER — Encounter: Payer: Self-pay | Admitting: Nurse Practitioner

## 2021-07-12 ENCOUNTER — Telehealth: Payer: Self-pay | Admitting: Nurse Practitioner

## 2021-07-12 ENCOUNTER — Ambulatory Visit: Payer: Medicare Other | Attending: Nurse Practitioner | Admitting: Nurse Practitioner

## 2021-07-12 ENCOUNTER — Other Ambulatory Visit: Payer: Self-pay

## 2021-07-12 VITALS — BP 132/89 | HR 68 | Temp 98.7°F | Resp 16 | Ht 64.0 in | Wt 183.0 lb

## 2021-07-12 DIAGNOSIS — Z0000 Encounter for general adult medical examination without abnormal findings: Secondary | ICD-10-CM

## 2021-07-12 DIAGNOSIS — Z23 Encounter for immunization: Secondary | ICD-10-CM | POA: Diagnosis not present

## 2021-07-12 MED ORDER — ZOSTER VAC RECOMB ADJUVANTED 50 MCG/0.5ML IM SUSR: 0.5000 mL | Freq: Once | INTRAMUSCULAR | 0 refills | Status: AC
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
## 2021-07-18 ENCOUNTER — Telehealth: Payer: Self-pay

## 2021-07-18 NOTE — Telephone Encounter (Signed)
Called pt left a vm to return call to the office.

## 2021-07-21 NOTE — Telephone Encounter (Signed)
NEEDS LUNG CT SCHEDULED

## 2021-07-25 ENCOUNTER — Ambulatory Visit (HOSPITAL_COMMUNITY): Payer: Medicare Other

## 2021-08-11 ENCOUNTER — Ambulatory Visit: Admission: RE | Admit: 2021-08-11 | Payer: Medicare Other | Source: Ambulatory Visit

## 2021-08-11 ENCOUNTER — Ambulatory Visit
Admission: RE | Admit: 2021-08-11 | Discharge: 2021-08-11 | Disposition: A | Discharge status: Home / Self Care | Payer: Medicare Other | Source: Ambulatory Visit | Attending: Physician Assistant | Admitting: Physician Assistant

## 2021-08-11 DIAGNOSIS — R922 Inconclusive mammogram: Secondary | ICD-10-CM | POA: Diagnosis not present

## 2021-08-11 DIAGNOSIS — R928 Other abnormal and inconclusive findings on diagnostic imaging of breast: Secondary | ICD-10-CM

## 2021-08-14 ENCOUNTER — Telehealth: Payer: Self-pay | Admitting: *Deleted

## 2021-08-14 NOTE — Telephone Encounter (Signed)
-----   Message from Roney Jaffe, [place] sent at 08/14/2021 10:40 AM EST ----- Please call patient and let her know that her left diagnostic mammogram showed no evidence of malignancy in the left breast.  A bilateral screening mammogram repeat in 1 year is recommended.

## 2021-08-14 NOTE — Telephone Encounter (Signed)
Patient verified DOB Patient is aware of no malignancy being noted and to repeat bilateral mammogram in 1 year.

## 2021-08-23 ENCOUNTER — Ambulatory Visit: Payer: Medicare Other | Admitting: Nurse Practitioner

## 2021-11-02 ENCOUNTER — Other Ambulatory Visit: Payer: Self-pay

## 2021-11-02 ENCOUNTER — Other Ambulatory Visit: Payer: Self-pay | Admitting: Nurse Practitioner

## 2021-11-02 ENCOUNTER — Other Ambulatory Visit: Payer: Self-pay | Admitting: Physician Assistant

## 2021-11-02 DIAGNOSIS — E782 Mixed hyperlipidemia: Secondary | ICD-10-CM

## 2021-11-02 DIAGNOSIS — I10 Essential (primary) hypertension: Secondary | ICD-10-CM

## 2021-11-02 MED ORDER — ROSUVASTATIN CALCIUM 20 MG PO TABS: ORAL_TABLET | ORAL | 1 refills | Status: DC

## 2021-11-13 ENCOUNTER — Ambulatory Visit (HOSPITAL_COMMUNITY)
Admission: EM | Admit: 2021-11-13 | Discharge: 2021-11-13 | Disposition: A | Discharge status: Home / Self Care | Payer: Medicare Other | Attending: Internal Medicine | Admitting: Internal Medicine

## 2021-11-13 ENCOUNTER — Ambulatory Visit: Payer: Self-pay

## 2021-11-13 ENCOUNTER — Encounter (HOSPITAL_COMMUNITY): Payer: Self-pay | Admitting: *Deleted

## 2021-11-13 ENCOUNTER — Other Ambulatory Visit: Payer: Self-pay

## 2021-11-13 DIAGNOSIS — Z79899 Other long term (current) drug therapy: Secondary | ICD-10-CM | POA: Insufficient documentation

## 2021-11-13 DIAGNOSIS — K5900 Constipation, unspecified: Secondary | ICD-10-CM | POA: Diagnosis not present

## 2021-11-13 DIAGNOSIS — R42 Dizziness and giddiness: Secondary | ICD-10-CM | POA: Insufficient documentation

## 2021-11-13 DIAGNOSIS — E559 Vitamin D deficiency, unspecified: Secondary | ICD-10-CM | POA: Diagnosis not present

## 2021-11-13 LAB — CBC WITH DIFFERENTIAL/PLATELET
Abs Immature Granulocytes: 0.01 10*3/uL (ref 0.00–0.07)
Basophils Absolute: 0 10*3/uL (ref 0.0–0.1)
Basophils Relative: 0 %
Eosinophils Absolute: 0.1 10*3/uL (ref 0.0–0.5)
Eosinophils Relative: 1 %
HCT: 43.6 % (ref 36.0–46.0)
Hemoglobin: 13.5 g/dL (ref 12.0–15.0)
Immature Granulocytes: 0 %
Lymphocytes Relative: 52 %
Lymphs Abs: 2.3 10*3/uL (ref 0.7–4.0)
MCH: 24.5 pg — ABNORMAL LOW (ref 26.0–34.0)
MCHC: 31 g/dL (ref 30.0–36.0)
MCV: 79.3 fL — ABNORMAL LOW (ref 80.0–100.0)
Monocytes Absolute: 0.4 10*3/uL (ref 0.1–1.0)
Monocytes Relative: 8 %
Neutro Abs: 1.8 10*3/uL (ref 1.7–7.7)
Neutrophils Relative %: 39 %
Platelets: 237 10*3/uL (ref 150–400)
RBC: 5.5 MIL/uL — ABNORMAL HIGH (ref 3.87–5.11)
RDW: 17.3 % — ABNORMAL HIGH (ref 11.5–15.5)
WBC: 4.5 10*3/uL (ref 4.0–10.5)
nRBC: 0 % (ref 0.0–0.2)

## 2021-11-13 LAB — BASIC METABOLIC PANEL
Anion gap: 9 (ref 5–15)
BUN: 19 mg/dL (ref 8–23)
CO2: 26 mmol/L (ref 22–32)
Calcium: 9.6 mg/dL (ref 8.9–10.3)
Chloride: 104 mmol/L (ref 98–111)
Creatinine, Ser: 1 mg/dL (ref 0.44–1.00)
GFR, Estimated: 60 mL/min — ABNORMAL LOW (ref >60)
Glucose, Bld: 88 mg/dL (ref 70–99)
Potassium: 4.2 mmol/L (ref 3.5–5.1)
Sodium: 139 mmol/L (ref 135–145)

## 2021-11-13 LAB — VITAMIN D 25 HYDROXY (VIT D DEFICIENCY, FRACTURES): Vit D, 25-Hydroxy: 25.51 ng/mL — ABNORMAL LOW (ref 30–100)

## 2021-11-13 LAB — TSH: TSH: 0.488 u[IU]/mL (ref 0.350–4.500)

## 2021-11-13 MED ORDER — SENNOSIDES-DOCUSATE SODIUM 8.6-50 MG PO TABS: 1.0000 | ORAL_TABLET | Freq: Every day | ORAL | 0 refills | Status: AC

## 2021-11-13 MED ORDER — POLYETHYLENE GLYCOL 3350 17 G PO PACK
17.0000 g | PACK | Freq: Every day | ORAL | 0 refills | Status: DC | PRN
Start: 2021-11-13 — End: 2022-05-15

## 2021-11-13 NOTE — ED Triage Notes (Signed)
Pt reports diarrhea and dizzy for 3 days.

## 2021-11-13 NOTE — ED Provider Notes (Signed)
[facility]    CSN: [id number] Arrival date & time: 11/13/21  1604      History   Chief Complaint Chief Complaint  Patient presents with   Dizziness   Diarrhea    HPI Rebecca Pope is a 71 y.o. female comes to the urgent care with a 3-day history of dizziness.  Patient says the dizziness is intermittent.  No pattern noted.  No known aggravating or relieving factors.  No syncope or near syncopal events.  Patient denies any upper respiratory infection symptoms.  No ringing in ears.  No hearing loss.  No spinning sensation.  Patient denies any gait abnormalities.  Oral intake is preserved.  Patient has a history of vitamin D deficiency and has run out of her vitamin D medications.  She does not take any supplements.  Patient complains of constipation.  She has not had a bowel movement in the past 3 days.  She denies any abdominal bloating, distention, pain, nausea or vomiting.  HPI  Past Medical History:  Diagnosis Date   GERD (gastroesophageal reflux disease)    Hyperlipidemia    Hypertension    Osteopenia determined by x-ray     Patient Active Problem List   Diagnosis Date Noted   Mixed hyperlipidemia 05/22/2021   Prediabetes 05/22/2021   Vitamin D deficiency 05/22/2021   Gastroesophageal reflux disease 05/22/2021   Hepatitis C antibody test positive 08/07/2016   Essential hypertension 06/14/2016   Hypertension, uncontrolled 04/07/2013   HYPERCHOLESTEROLEMIA 08/22/2006   Class 1 obesity due to excess calories with serious comorbidity and body mass index (BMI) of 32.0 to 32.9 in adult 08/22/2006   Tobacco dependence 08/22/2006   TENSION HEADACHE 08/22/2006   VISUAL DISTURBANCE NOS 08/22/2006   HYPERTENSION, BENIGN SYSTEMIC 08/22/2006   LEUKOPLAKIA OF GINGIVA, LIPS OR TONGUE 08/22/2006   MURMUR, FUNCTIONAL 08/22/2006    Past Surgical History:  Procedure Laterality Date   APPENDECTOMY      OB History   No obstetric history on file.      Home  Medications    Prior to Admission medications   Medication Sig Start Date End Date Taking? Authorizing Provider  polyethylene glycol (MIRALAX) 17 g packet Take 17 g by mouth daily as needed. 11/13/21  Yes Lovie Agresta, Britta Mccreedy, MD  senna-docusate (SENOKOT-S) 8.6-50 MG tablet Take 1 tablet by mouth at bedtime for 14 days. 11/13/21 11/27/21 Yes Mitsy Owen, Britta Mccreedy, MD  amLODipine (NORVASC) 10 MG tablet TAKE 1 TABLET(10 MG) BY MOUTH DAILY 05/22/21   Mayers, Cari S, PA-C  aspirin EC 81 MG tablet Take 1 tablet (81 mg total) by mouth daily. 03/21/21   Claiborne Rigg, NP  omeprazole (PRILOSEC) 20 MG capsule TAKE 1 CAPSULE(20 MG) BY MOUTH DAILY 05/24/21   Hoy Register, MD  rosuvastatin (CRESTOR) 20 MG tablet TAKE 1 TABLET(20 MG) BY MOUTH DAILY 11/02/21   Claiborne Rigg, NP  Vitamin D, Ergocalciferol, (DRISDOL) 1.25 MG (50000 UNIT) CAPS capsule TAKE ONE CAPSULE BY MOUTH EVERY 7 DAYS 05/24/21   Hoy Register, MD    Family History Family History  Problem Relation Age of Onset   Cancer Mother    Hypertension Mother    Cancer Father    Breast cancer Neg Hx     Social History Social History   Tobacco Use   Smoking status: Every Day    Packs/day: 1.00    Types: Cigarettes   Smokeless tobacco: Never  Vaping Use   Vaping Use: Never used  Substance Use  Topics   Alcohol use: No   Drug use: No     Allergies   Patient has no known allergies.   Review of Systems Review of Systems  Constitutional: Negative.   Respiratory: Negative.    Cardiovascular: Negative.  Negative for chest pain.  Gastrointestinal: Negative.   Genitourinary: Negative.   Neurological:  Positive for dizziness. Negative for tremors, seizures, syncope, facial asymmetry, speech difficulty, weakness, light-headedness, numbness and headaches.    Physical Exam Triage Vital Signs ED Triage Vitals  Enc Vitals Group     BP 11/13/21 1717 (!) 153/94     Pulse Rate 11/13/21 1717 (!) 52     Resp 11/13/21 1717 18     Temp  11/13/21 1717 99 F (37.2 C)     Temp src --      SpO2 11/13/21 1717 97 %     Weight --      Height --      Head Circumference --      Peak Flow --      Pain Score 11/13/21 1715 0     Pain Loc --      Pain Edu? --      Excl. in GC? --    No data found.  Updated Vital Signs BP (!) 153/94   Pulse (!) 52   Temp 99 F (37.2 C)   Resp 18   SpO2 97%   Visual Acuity Right Eye Distance:   Left Eye Distance:   Bilateral Distance:    Right Eye Near:   Left Eye Near:    Bilateral Near:     Physical Exam Vitals and nursing note reviewed.  Constitutional:      General: She is not in acute distress.    Appearance: She is not ill-appearing.  Cardiovascular:     Rate and Rhythm: Normal rate and regular rhythm.     Pulses: Normal pulses.     Heart sounds: Normal heart sounds.  Pulmonary:     Effort: Pulmonary effort is normal.     Breath sounds: Normal breath sounds.  Neurological:     General: No focal deficit present.     Mental Status: She is alert and oriented to person, place, and time.     UC Treatments / Results  Labs (all labs ordered are listed, but only abnormal results are displayed) Labs Reviewed  CBC WITH DIFFERENTIAL/PLATELET  BASIC METABOLIC PANEL  VITAMIN D 25 HYDROXY (VIT D DEFICIENCY, FRACTURES)  TSH    EKG   Radiology No results found.  Procedures Procedures (including critical care time)  Medications Ordered in UC Medications - No data to display  Initial Impression / Assessment and Plan / UC Course  I have reviewed the triage vital signs and the nursing notes.  Pertinent labs & imaging results that were available during my care of the patient were reviewed by me and considered in my medical decision making (see chart for details).     1.  Dizziness and constipation: Increase oral fluid intake CBC, BMP and vitamin D We will call patient with recommendations if labs are abnormal Senokot nightly MiraLAX 17 g as needed for  constipation Return to urgent care if symptoms worsen We will call you with recommendations if labs are abnormal. Final Clinical Impressions(s) / UC Diagnoses   Final diagnoses:  Dizziness     Discharge Instructions      Increase fruit and vegetable intake Increase oral fluid intake Take medications as prescribed Stop laxatives  if you develop diarrhea We will call you with recommendations if labs are abnormal Return to urgent care if you have worsening symptoms.   ED Prescriptions     Medication Sig Dispense Auth. Provider   senna-docusate (SENOKOT-S) 8.6-50 MG tablet Take 1 tablet by mouth at bedtime for 14 days. 14 tablet Jamie Belger, Britta MccreedyPhilip O, MD   polyethylene glycol (MIRALAX) 17 g packet Take 17 g by mouth daily as needed. 14 each Deondra Wigger, Britta MccreedyPhilip O, MD      PDMP not reviewed this encounter.   Merrilee JanskyLamptey, Leda [place] O, MD 11/13/21 1910

## 2021-11-13 NOTE — Telephone Encounter (Signed)
Attempted to contact patient and message states that call can not be completed at this time.  If call is returned patient can be placed on provider [place] schedule for this Thursday afternoon.

## 2021-11-13 NOTE — Discharge Instructions (Addendum)
Increase fruit and vegetable intake Increase oral fluid intake Take medications as prescribed Stop laxatives if you develop diarrhea We will call you with recommendations if labs are abnormal Return to urgent care if you have worsening symptoms.

## 2021-11-13 NOTE — Telephone Encounter (Signed)
  Chief Complaint: dizziness Symptoms: Dizziness and lightheadedness Frequency: 3-4 days ago Pertinent Negatives: Patient denies fever, difficulty breathing Disposition: [] ED /[x] Urgent Care (no appt availability in office) / [] Appointment(In office/virtual)/ []  [place] [facility]/ [] Home Care/ [] Refused Recommended Disposition /[] [place] [facility]/ []  Follow-up with PCP Additional Notes: Pt has been light headed and dizzy for 3-4 days.  Reason for Disposition  [1] MODERATE dizziness (e.g., interferes with normal activities) AND [2] has NOT been evaluated by physician for this  (Exception: dizziness caused by heat exposure, sudden standing, or poor fluid intake)  Answer Assessment - Initial Assessment Questions 1. DESCRIPTION: "Describe your dizziness."     dizziness 2. LIGHTHEADED: "Do you feel lightheaded?" (e.g., somewhat faint, woozy, weak upon standing)     sometimes 3. VERTIGO: "Do you feel like either you or the room is spinning or tilting?" (i.e. vertigo)     no 4. SEVERITY: "How bad is it?"  "Do you feel like you are going to faint?" "Can you stand and walk?"   - MILD: Feels slightly dizzy, but walking normally.   - MODERATE: Feels unsteady when walking, but not falling; interferes with normal activities (e.g., school, work).   - SEVERE: Unable to walk without falling, or requires assistance to walk without falling; feels like passing out now.      mild 5. ONSET:  "When did the dizziness begin?"     3-4 days ago 6. AGGRAVATING FACTORS: "Does anything make it worse?" (e.g., standing, change in head position)     no 7. HEART RATE: "Can you tell me your heart rate?" "How many beats in 15 seconds?"  (Note: not all patients can do this)       na 8. CAUSE: "What do you think is causing the dizziness?"     unknown 9. RECURRENT SYMPTOM: "Have you had dizziness before?" If Yes, ask: "When was the last time?" "What happened that time?"     no 10. OTHER SYMPTOMS: "Do you have  any other symptoms?" (e.g., fever, chest pain, vomiting, diarrhea, bleeding)       diarrhea 11. PREGNANCY: "Is there any chance you are pregnant?" "When was your last menstrual period?"       na  Protocols used: Dizziness - Lightheadedness-A-AH

## 2022-03-19 ENCOUNTER — Ambulatory Visit: Payer: Medicare Other | Admitting: Nurse Practitioner

## 2022-05-15 ENCOUNTER — Ambulatory Visit: Payer: Medicare Other | Attending: Nurse Practitioner | Admitting: Nurse Practitioner

## 2022-05-15 ENCOUNTER — Encounter: Payer: Self-pay | Admitting: Nurse Practitioner

## 2022-05-15 VITALS — BP 120/83 | HR 69 | Ht 64.0 in | Wt 180.0 lb

## 2022-05-15 DIAGNOSIS — E782 Mixed hyperlipidemia: Secondary | ICD-10-CM

## 2022-05-15 DIAGNOSIS — K219 Gastro-esophageal reflux disease without esophagitis: Secondary | ICD-10-CM | POA: Diagnosis not present

## 2022-05-15 DIAGNOSIS — I10 Essential (primary) hypertension: Secondary | ICD-10-CM

## 2022-05-15 DIAGNOSIS — Z23 Encounter for immunization: Secondary | ICD-10-CM

## 2022-05-15 DIAGNOSIS — R7989 Other specified abnormal findings of blood chemistry: Secondary | ICD-10-CM

## 2022-05-15 DIAGNOSIS — E559 Vitamin D deficiency, unspecified: Secondary | ICD-10-CM

## 2022-05-15 MED ORDER — AMLODIPINE BESYLATE 10 MG PO TABS: 10.0000 mg | ORAL_TABLET | Freq: Every day | ORAL | 1 refills | Status: DC

## 2022-05-15 MED ORDER — ROSUVASTATIN CALCIUM 20 MG PO TABS: ORAL_TABLET | ORAL | 1 refills | Status: DC

## 2022-05-15 MED ORDER — VITAMIN D (ERGOCALCIFEROL) 1.25 MG (50000 UNIT) PO CAPS: 50000.0000 [IU] | ORAL_CAPSULE | ORAL | 0 refills | Status: AC

## 2022-05-15 MED ORDER — OMEPRAZOLE 20 MG PO CPDR: 20.0000 mg | DELAYED_RELEASE_CAPSULE | Freq: Every day | ORAL | 1 refills | Status: DC

## 2022-05-15 NOTE — Progress Notes (Signed)
Assessment & Plan:  Rebecca Pope was seen today for hypertension.  Diagnoses and all orders for this visit:  Essential hypertension -     amLODipine (NORVASC) 10 MG tablet; Take 1 tablet (10 mg total) by mouth daily. TAKE 1 TABLET(10 MG) BY MOUTH DAILY Continue all antihypertensives as prescribed.  Reminded to bring in blood pressure log for follow  up appointment.  RECOMMENDATIONS: DASH/Mediterranean Diets are healthier choices for HTN.    Mixed hyperlipidemia -     rosuvastatin (CRESTOR) 20 MG tablet; TAKE 1 TABLET(20 MG) BY MOUTH DAILY INSTRUCTIONS: Work on a low fat, heart healthy diet and participate in regular aerobic exercise program by working out at least 150 minutes per week; 5 days a week-30 minutes per day. Avoid red meat/beef/steak,  fried foods. junk foods, sodas, sugary drinks, unhealthy snacking, alcohol and smoking.  Drink at least 80 oz of water per day and monitor your carbohydrate intake daily.    Vitamin D deficiency -     Vitamin D, Ergocalciferol, (DRISDOL) 1.25 MG (50000 UNIT) CAPS capsule; Take 1 capsule (50,000 Units total) by mouth every 7 (seven) days. Please fill as a 90 day supply  Gastroesophageal reflux disease, unspecified whether esophagitis present -     omeprazole (PRILOSEC) 20 MG capsule; Take 1 capsule (20 mg total) by mouth daily.  Flu vaccine need -     Flu Vaccine QUAD High Dose(Fluad)  Abnormal CBC -     CBC with Differential    Patient has been counseled on age-appropriate routine health concerns for screening and prevention. These are reviewed and up-to-date. Referrals have been placed accordingly. Immunizations are up-to-date or declined.    Subjective:   Chief Complaint  Patient presents with   Hypertension    Memory issues    HPI Rebecca Pope 71 y.o. female presents to office today for follow up to HTN. Her significant other has concerns of memory loss however Rebecca Pope does not feel she has any issues with this and MMSE was  normal today with a score of 25     05/15/2022   10:22 AM 07/12/2021   10:39 AM  MMSE - Mini Mental State Exam  Orientation to time 4 4  Orientation to Place 5 5  Registration 3 3  Attention/ Calculation 5 5  Recall 1 2  Language- name 2 objects 2 2  Language- repeat 1 1  Language- follow 3 step command 2 3  Language- read & follow direction 1 1  Write a sentence 1 1  Copy design 0 1  Total score 25 28    HTN Blood pressure is well controlled with amlodipine 10 mg daily.  BP Readings from Last 3 Encounters:  05/15/22 120/83  11/13/21 (!) 153/94  07/12/21 132/89    HPL Lipids not at goal with crestor 20 mg daily.  Lab Results  Component Value Date   CHOL 193 07/26/2020   CHOL 218 (H) 08/27/2019   CHOL 193 05/28/2019   Lab Results  Component Value Date   HDL 49 07/26/2020   HDL 60 08/27/2019   HDL 54 05/28/2019   Lab Results  Component Value Date   LDLCALC 123 (H) 07/26/2020   LDLCALC 142 (H) 08/27/2019   LDLCALC 112 (H) 05/28/2019   Lab Results  Component Value Date   TRIG 119 07/26/2020   TRIG 90 08/27/2019   TRIG 152 (H) 05/28/2019   Lab Results  Component Value Date   CHOLHDL 3.9 07/26/2020   CHOLHDL  3.6 08/27/2019   CHOLHDL 3.6 05/28/2019   No results found for: "LDLDIRECT"   Review of Systems  Constitutional:  Negative for fever, malaise/fatigue and weight loss.  HENT: Negative.  Negative for nosebleeds.   Eyes: Negative.  Negative for blurred vision, double vision and photophobia.  Respiratory: Negative.  Negative for cough and shortness of breath.   Cardiovascular: Negative.  Negative for chest pain, palpitations and leg swelling.  Gastrointestinal: Negative.  Negative for heartburn, nausea and vomiting.  Musculoskeletal: Negative.  Negative for myalgias.  Neurological: Negative.  Negative for dizziness, focal weakness, seizures and headaches.  Psychiatric/Behavioral: Negative.  Negative for suicidal ideas.     Past Medical History:   Diagnosis Date   GERD (gastroesophageal reflux disease)    Hyperlipidemia    Hypertension    Osteopenia determined by x-ray     Past Surgical History:  Procedure Laterality Date   APPENDECTOMY      Family History  Problem Relation Age of Onset   Cancer Mother    Hypertension Mother    Cancer Father    Breast cancer Neg Hx     Social History Reviewed with no changes to be made today.   Outpatient Medications Prior to Visit  Medication Sig Dispense Refill   amLODipine (NORVASC) 10 MG tablet TAKE 1 TABLET(10 MG) BY MOUTH DAILY 90 tablet 1   omeprazole (PRILOSEC) 20 MG capsule TAKE 1 CAPSULE(20 MG) BY MOUTH DAILY 90 capsule 1   rosuvastatin (CRESTOR) 20 MG tablet TAKE 1 TABLET(20 MG) BY MOUTH DAILY 90 tablet 1   Vitamin D, Ergocalciferol, (DRISDOL) 1.25 MG (50000 UNIT) CAPS capsule TAKE ONE CAPSULE BY MOUTH EVERY 7 DAYS 16 capsule 0   aspirin EC 81 MG tablet Take 1 tablet (81 mg total) by mouth daily. (Patient not taking: Reported on 05/15/2022) 90 tablet 0   polyethylene glycol (MIRALAX) 17 g packet Take 17 g by mouth daily as needed. (Patient not taking: Reported on 05/15/2022) 14 each 0   No facility-administered medications prior to visit.    No Known Allergies     Objective:    BP 120/83   Pulse 69   Ht 5\' 4"  (1.626 m)   Wt 180 lb (81.6 kg)   SpO2 97%   BMI 30.90 kg/m  Wt Readings from Last 3 Encounters:  05/15/22 180 lb (81.6 kg)  07/12/21 183 lb (83 kg)  05/22/21 187 lb (84.8 kg)    Physical Exam Vitals and nursing note reviewed.  Constitutional:      Appearance: She is well-developed.  HENT:     Head: Normocephalic and atraumatic.  Cardiovascular:     Rate and Rhythm: Normal rate and regular rhythm.     Heart sounds: Normal heart sounds. No murmur heard.    No friction rub. No gallop.  Pulmonary:     Effort: Pulmonary effort is normal. No tachypnea or respiratory distress.     Breath sounds: Normal breath sounds. No decreased breath sounds,  wheezing, rhonchi or rales.  Chest:     Chest wall: No tenderness.  Abdominal:     General: Bowel sounds are normal.     Palpations: Abdomen is soft.  Musculoskeletal:        General: Normal range of motion.     Cervical back: Normal range of motion.  Skin:    General: Skin is warm and dry.  Neurological:     Mental Status: She is alert and oriented to person, place, and time.  Coordination: Coordination normal.  Psychiatric:        Behavior: Behavior normal. Behavior is cooperative.        Thought Content: Thought content normal.        Judgment: Judgment normal.          Patient has been counseled extensively about nutrition and exercise as well as the importance of adherence with medications and regular follow-up. The patient was given clear instructions to go to ER or return to medical center if symptoms don't improve, worsen or new problems develop. The patient verbalized understanding.   Follow-up: Return in about 3 months (around 08/15/2022).   Claiborne Rigg, FNP-BC [facility] [place], [place] [phone]   05/15/2022, 1:33 PM

## 2022-05-16 LAB — CBC WITH DIFFERENTIAL/PLATELET
Basophils Absolute: 0 10*3/uL (ref 0.0–0.2)
Basos: 0 %
EOS (ABSOLUTE): 0.1 10*3/uL (ref 0.0–0.4)
Eos: 1 %
Hematocrit: 43.5 % (ref 34.0–46.6)
Hemoglobin: 13.5 g/dL (ref 11.1–15.9)
Immature Grans (Abs): 0 10*3/uL (ref 0.0–0.1)
Immature Granulocytes: 0 %
Lymphocytes Absolute: 2 10*3/uL (ref 0.7–3.1)
Lymphs: 41 %
MCH: 24.9 pg — ABNORMAL LOW (ref 26.6–33.0)
MCHC: 31 g/dL — ABNORMAL LOW (ref 31.5–35.7)
MCV: 80 fL (ref 79–97)
Monocytes Absolute: 0.5 10*3/uL (ref 0.1–0.9)
Monocytes: 11 %
Neutrophils Absolute: 2.2 10*3/uL (ref 1.4–7.0)
Neutrophils: 47 %
Platelets: 209 10*3/uL (ref 150–450)
RBC: 5.43 x10E6/uL — ABNORMAL HIGH (ref 3.77–5.28)
RDW: 15.2 % (ref 11.7–15.4)
WBC: 4.8 10*3/uL (ref 3.4–10.8)

## 2022-05-16 LAB — CMP14+EGFR
ALT: 8 IU/L (ref 0–32)
AST: 11 IU/L (ref 0–40)
Albumin/Globulin Ratio: 1.3 (ref 1.2–2.2)
Albumin: 4.3 g/dL (ref 3.8–4.8)
Alkaline Phosphatase: 102 IU/L (ref 44–121)
BUN/Creatinine Ratio: 13 (ref 12–28)
BUN: 14 mg/dL (ref 8–27)
Bilirubin Total: <0.2 mg/dL (ref 0.0–1.2)
CO2: 27 mmol/L (ref 20–29)
Calcium: 9.8 mg/dL (ref 8.7–10.3)
Chloride: 98 mmol/L (ref 96–106)
Creatinine, Ser: 1.09 mg/dL — ABNORMAL HIGH (ref 0.57–1.00)
Globulin, Total: 3.3 g/dL (ref 1.5–4.5)
Glucose: 85 mg/dL (ref 70–99)
Potassium: 3.7 mmol/L (ref 3.5–5.2)
Sodium: 140 mmol/L (ref 134–144)
Total Protein: 7.6 g/dL (ref 6.0–8.5)
eGFR: 54 mL/min/{1.73_m2} — ABNORMAL LOW (ref >59)

## 2022-08-20 ENCOUNTER — Encounter: Payer: Self-pay | Admitting: Nurse Practitioner

## 2022-08-20 ENCOUNTER — Ambulatory Visit: Payer: Medicare Other | Attending: Nurse Practitioner | Admitting: Nurse Practitioner

## 2022-08-20 VITALS — BP 130/82 | HR 86 | Ht 64.0 in | Wt 178.6 lb

## 2022-08-20 DIAGNOSIS — E782 Mixed hyperlipidemia: Secondary | ICD-10-CM | POA: Diagnosis not present

## 2022-08-20 DIAGNOSIS — J410 Simple chronic bronchitis: Secondary | ICD-10-CM

## 2022-08-20 DIAGNOSIS — Z1211 Encounter for screening for malignant neoplasm of colon: Secondary | ICD-10-CM | POA: Diagnosis not present

## 2022-08-20 DIAGNOSIS — I10 Essential (primary) hypertension: Secondary | ICD-10-CM

## 2022-08-20 DIAGNOSIS — F17200 Nicotine dependence, unspecified, uncomplicated: Secondary | ICD-10-CM

## 2022-08-20 DIAGNOSIS — K219 Gastro-esophageal reflux disease without esophagitis: Secondary | ICD-10-CM

## 2022-08-20 DIAGNOSIS — Z1231 Encounter for screening mammogram for malignant neoplasm of breast: Secondary | ICD-10-CM

## 2022-08-20 MED ORDER — AMLODIPINE BESYLATE 10 MG PO TABS: 10.0000 mg | ORAL_TABLET | Freq: Every day | ORAL | 1 refills | Status: AC

## 2022-08-20 MED ORDER — PREDNISONE 20 MG PO TABS: 20.0000 mg | ORAL_TABLET | Freq: Every day | ORAL | 0 refills | Status: AC

## 2022-08-20 MED ORDER — ROSUVASTATIN CALCIUM 20 MG PO TABS: ORAL_TABLET | ORAL | 1 refills | Status: AC

## 2022-08-20 MED ORDER — OMEPRAZOLE 20 MG PO CPDR
20.0000 mg | DELAYED_RELEASE_CAPSULE | Freq: Every day | ORAL | 1 refills | Status: AC
Start: 2022-08-20 — End: ?

## 2022-08-20 NOTE — Patient Instructions (Signed)
[facility]: [facility] Address: [address], [place], [place] [address] Open ? Closes 5:30?PM Phone: [phone]

## 2022-08-20 NOTE — Progress Notes (Signed)
On going cough. Started 4 days ago.

## 2022-08-20 NOTE — Progress Notes (Signed)
Assessment & Plan:  Rebecca Pope was seen today for hypertension and cough.  Diagnoses and all orders for this visit:  Primary hypertension -     amLODipine (NORVASC) 10 MG tablet; Take 1 tablet (10 mg total) by mouth daily. TAKE 1 TABLET(10 MG) BY MOUTH DAILY Continue all antihypertensives as prescribed.  Reminded to bring in blood pressure log for follow  up appointment.  RECOMMENDATIONS: DASH/Mediterranean Diets are healthier choices for HTN.    Mixed hyperlipidemia -     rosuvastatin (CRESTOR) 20 MG tablet; TAKE 1 TABLET(20 MG) BY MOUTH DAILY INSTRUCTIONS: Work on a low fat, heart healthy diet and participate in regular aerobic exercise program by working out at least 150 minutes per week; 5 days a week-30 minutes per day. Avoid red meat/beef/steak,  fried foods. junk foods, sodas, sugary drinks, unhealthy snacking, alcohol and smoking.  Drink at least 80 oz of water per day and monitor your carbohydrate intake daily.    Gastroesophageal reflux disease, unspecified whether esophagitis present Well controlled  -     omeprazole (PRILOSEC) 20 MG capsule; Take 1 capsule (20 mg total) by mouth daily. INSTRUCTIONS: Avoid GERD Triggers: acidic, spicy or fried foods, caffeine, coffee, sodas,  alcohol and chocolate.    Screening for colon cancer -     Fecal occult blood, imunochemical  Tobacco dependence -     CT CHEST LUNG CA SCREEN LOW DOSE W/O CM; Future  Breast cancer screening by mammogram -     MM 3D SCREEN BREAST BILATERAL; Future  Smokers' cough (HCC) -     predniSONE (DELTASONE) 20 MG tablet; Take 1 tablet (20 mg total) by mouth daily with breakfast for 5 days.    Patient has been counseled on age-appropriate routine health concerns for screening and prevention. These are reviewed and up-to-date. Referrals have been placed accordingly. Immunizations are up-to-date or declined.    Subjective:   Chief Complaint  Patient presents with   Hypertension   Cough   HPI Rebecca Pope 72 y.o. female presents to office today for follow up to HTN  She has a PMH of GERD, HPL, HTN and osteopenia  Essential Hypertension Well controlled. She is taking amlodipine 10 mg daily. Denies chest pain, shortness of breath, palpitations, lightheadedness, dizziness, headaches or BLE edema. She continues to smoke however she is trying to "cut back". Down to about half a pack of cigarettes per day BP Readings from Last 3 Encounters:  08/20/22 130/82  05/15/22 120/83  11/13/21 (!) 153/94    Cough Endorses new onset of cough. Cough is dry and persistent. Keeping her up at night. Not associated with any other URI symptoms.       Review of Systems  Constitutional:  Negative for fever, malaise/fatigue and weight loss.  HENT: Negative.  Negative for nosebleeds.   Eyes: Negative.  Negative for blurred vision, double vision and photophobia.  Respiratory: Negative.  Negative for cough and shortness of breath.   Cardiovascular: Negative.  Negative for chest pain, palpitations and leg swelling.  Gastrointestinal: Negative.  Negative for heartburn, nausea and vomiting.  Musculoskeletal: Negative.  Negative for myalgias.  Neurological: Negative.  Negative for dizziness, focal weakness, seizures and headaches.  Psychiatric/Behavioral: Negative.  Negative for suicidal ideas.     Past Medical History:  Diagnosis Date   GERD (gastroesophageal reflux disease)    Hyperlipidemia    Hypertension    Osteopenia determined by x-ray     Past Surgical History:  Procedure Laterality Date  APPENDECTOMY      Family History  Problem Relation Age of Onset   Cancer Mother    Hypertension Mother    Cancer Father    Breast cancer Neg Hx     Social History Reviewed with no changes to be made today.   Outpatient Medications Prior to Visit  Medication Sig Dispense Refill   Vitamin D, Ergocalciferol, (DRISDOL) 1.25 MG (50000 UNIT) CAPS capsule Take 1 capsule (50,000 Units total) by mouth  every 7 (seven) days. Please fill as a 90 day supply 12 capsule 0   amLODipine (NORVASC) 10 MG tablet Take 1 tablet (10 mg total) by mouth daily. TAKE 1 TABLET(10 MG) BY MOUTH DAILY 90 tablet 1   omeprazole (PRILOSEC) 20 MG capsule Take 1 capsule (20 mg total) by mouth daily. 90 capsule 1   rosuvastatin (CRESTOR) 20 MG tablet TAKE 1 TABLET(20 MG) BY MOUTH DAILY 90 tablet 1   No facility-administered medications prior to visit.    No Known Allergies     Objective:    BP 130/82   Pulse 86   Ht '5\' 4"'$  (1.626 m)   Wt 178 lb 9.6 oz (81 kg)   SpO2 (!) 74%   BMI 30.66 kg/m  Wt Readings from Last 3 Encounters:  08/20/22 178 lb 9.6 oz (81 kg)  05/15/22 180 lb (81.6 kg)  07/12/21 183 lb (83 kg)    Physical Exam Vitals and nursing note reviewed.  Constitutional:      Appearance: She is well-developed.  HENT:     Head: Normocephalic and atraumatic.  Cardiovascular:     Rate and Rhythm: Normal rate and regular rhythm.     Heart sounds: Normal heart sounds. No murmur heard.    No friction rub. No gallop.  Pulmonary:     Effort: Pulmonary effort is normal. No tachypnea or respiratory distress.     Breath sounds: Normal breath sounds. No decreased breath sounds, wheezing, rhonchi or rales.  Chest:     Chest wall: No tenderness.  Abdominal:     General: Bowel sounds are normal.     Palpations: Abdomen is soft.  Musculoskeletal:        General: Normal range of motion.     Cervical back: Normal range of motion.  Skin:    General: Skin is warm and dry.  Neurological:     Mental Status: She is alert and oriented to person, place, and time.     Coordination: Coordination normal.  Psychiatric:        Behavior: Behavior normal. Behavior is cooperative.        Thought Content: Thought content normal.        Judgment: Judgment normal.          Patient has been counseled extensively about nutrition and exercise as well as the importance of adherence with medications and regular  follow-up. The patient was given clear instructions to go to ER or return to medical center if symptoms don't improve, worsen or new problems develop. The patient verbalized understanding.   Follow-up: Return in about 3 months (around 11/18/2022) for HTN.   Rebecca Pounds, FNP-BC [facility] [place], [place]   08/29/2022, 11:36 PM

## 2022-08-22 ENCOUNTER — Ambulatory Visit: Payer: Medicare Other | Attending: Family Medicine

## 2022-08-23 NOTE — Progress Notes (Signed)
Patient did not answer phone when called twice.

## 2022-08-29 ENCOUNTER — Encounter: Payer: Self-pay | Admitting: Nurse Practitioner

## 2022-08-31 ENCOUNTER — Ambulatory Visit
Admission: RE | Admit: 2022-08-31 | Discharge: 2022-08-31 | Disposition: A | Discharge status: Home / Self Care | Payer: Medicare Other | Source: Ambulatory Visit | Attending: Nurse Practitioner | Admitting: Nurse Practitioner

## 2022-08-31 DIAGNOSIS — Z1231 Encounter for screening mammogram for malignant neoplasm of breast: Secondary | ICD-10-CM

## 2022-09-02 LAB — FECAL OCCULT BLOOD, IMMUNOCHEMICAL: Fecal Occult Bld: NEGATIVE

## 2022-09-04 ENCOUNTER — Other Ambulatory Visit: Payer: Self-pay

## 2022-09-14 ENCOUNTER — Inpatient Hospital Stay: Admission: RE | Admit: 2022-09-14 | Payer: Medicare Other | Source: Ambulatory Visit

## 2022-09-25 NOTE — Addendum Note (Signed)
Addended by: Karle Barr on: 09/25/2022 09:01 AM   Modules accepted: Level of Service

## 2022-10-17 ENCOUNTER — Ambulatory Visit: Payer: Medicare Other | Attending: Nurse Practitioner | Admitting: Pharmacist

## 2022-10-17 ENCOUNTER — Encounter: Payer: Self-pay | Admitting: Pharmacist

## 2022-10-17 DIAGNOSIS — Z79899 Other long term (current) drug therapy: Secondary | ICD-10-CM

## 2022-10-17 NOTE — Progress Notes (Signed)
10/17/2022 Name: Rebecca Pope MRN: [id number] DOB: 18-Jul-1950  No chief complaint on file.  Rebecca Pope is a 72 y.o. year old female who presented for a telephone visit.   They were referred to the pharmacist by a quality report for assistance in managing hyperlipidemia and complex medication management.   Patient is participating in a Managed Medicaid Plan: no  Subjective:  Care Team: Primary Care Provider: Claiborne Rigg, NP ; Next Scheduled Visit: 11/20/2022  Medication Access/Adherence  Current Pharmacy:  [id number] [place] Otto, [place] - [address] AT [facility] [address] [place] [address] Phone: [phone] Fax: [phone]   Patient reports affordability concerns with their medications: No  Patient reports access/transportation concerns to their pharmacy: No  Patient reports adherence concerns with their medications:  No     Hyperlipidemia/ASCVD Risk Reduction  Current lipid lowering medications: rosuvastatin 20 mg daily  ASCVD History: no clinical ASCVD Family History: HTN, cancer Risk Factors: age, hyperlipidemia, HTN, obesity, tobacco use among others   Current physical activity: none  Current medication access support: fills 90-day supplies at [facility]   Medication Management:  Current adherence strategy: fills 90-day supplies with [facility]  Patient reports Fair adherence to medications  Patient reports the following barriers to adherence: none reported  Recent fill dates: rosuvastatin was last filled 08/20/2022 for a 90 day supply. Before then, she filled a 90-day supply on 05/21/2022.  Objective:  Lab Results  Component Value Date   HGBA1C 5.8 (A) 05/22/2021    Lab Results  Component Value Date   CREATININE 1.09 (H) 05/15/2022   BUN 14 05/15/2022   NA 140 05/15/2022   K 3.7 05/15/2022   CL 98 05/15/2022   CO2 27 05/15/2022    Lab Results  Component Value Date    CHOL 193 07/26/2020   HDL 49 07/26/2020   LDLCALC 123 (H) 07/26/2020   TRIG 119 07/26/2020   CHOLHDL 3.9 07/26/2020    The 13-KGMW ASCVD risk score (Arnett DK, et al., 2019) is: 25%   Values used to calculate the score:     Age: 45 years     Sex: Female     Is Non-Hispanic African American: Yes     Diabetic: No     Tobacco smoker: Yes     Systolic Blood Pressure: 130 mmHg     Is BP treated: Yes     HDL Cholesterol: 49 mg/dL     Total Cholesterol: 193 mg/dL  Medications Reviewed Today     Reviewed by Claiborne Rigg, NP (Nurse Practitioner) on 08/29/22 at 2336  Med List Status: <None>   Medication Order Taking? Sig Documenting Provider Last Dose Status Informant  amLODipine (NORVASC) 10 MG tablet [id number]  Take 1 tablet (10 mg total) by mouth daily. TAKE 1 TABLET(10 MG) BY MOUTH DAILY Claiborne Rigg, NP  Active   omeprazole (PRILOSEC) 20 MG capsule [id number]  Take 1 capsule (20 mg total) by mouth daily. Claiborne Rigg, NP  Active   rosuvastatin (CRESTOR) 20 MG tablet [id number]  TAKE 1 TABLET(20 MG) BY MOUTH DAILY Claiborne Rigg, NP  Active   Vitamin D, Ergocalciferol, (DRISDOL) 1.25 MG (50000 UNIT) CAPS capsule [id number] Yes Take 1 capsule (50,000 Units total) by mouth every 7 (seven) days. Please fill as a 90 day supply Claiborne Rigg, NP Taking Active  Assessment/Plan:   Hyperlipidemia/ASCVD Risk Reduction: - Currently uncontrolled, however, we need an updated lipid panel. She is not a clinical ASCVD patient. Her 10-yr risk is >20%. Therefore, high intensity statin therapy is appropriate and her LDL goal should be <70 mg/dL. - Reviewed long term complications of uncontrolled cholesterol - Recommend to continue high intensity rosuvastatin, 20 mg daily.    Medication Management: - Currently strategy sufficient to maintain appropriate adherence to prescribed medication regimen - Suggested use of weekly pill box to organize medications - Created  list of medication, indication, and administration time. Provided to patient -Called her pharmacy and her recent fills indicate fair adherence.    Follow Up Plan: PCP in May. Consider an updated lipid at that appt.   Butch Penny, PharmD, Patsy [place], CPP Clinical Pharmacist [facility] [phone]

## 2022-10-29 ENCOUNTER — Telehealth: Payer: Self-pay | Admitting: Nurse Practitioner

## 2022-10-29 NOTE — Telephone Encounter (Signed)
Copied from CRM [id number]. Topic: Medicare AWV >> Oct 29, 2022 11:20 AM Rushie Goltz wrote: Called patient to schedule Medicare Annual Wellness Visit (AWV). No voicemail available to leave a message.  Last date of AWV: 07/12/2021  Please schedule an AWVS appointment at any time with [facility].  If any questions, please contact me at [phone].    Thank you,  [facility] Support [facility] Direct dial  [phone]

## 2022-11-20 ENCOUNTER — Ambulatory Visit: Payer: Medicare Other | Admitting: Nurse Practitioner

## 2023-03-27 ENCOUNTER — Ambulatory Visit: Payer: Medicare Other | Admitting: Nurse Practitioner

## 2023-05-07 ENCOUNTER — Ambulatory Visit: Payer: Medicare Other | Attending: Nurse Practitioner

## 2023-05-07 VITALS — Ht 64.0 in | Wt 180.0 lb

## 2023-05-07 DIAGNOSIS — Z0000 Encounter for general adult medical examination without abnormal findings: Secondary | ICD-10-CM

## 2023-05-07 NOTE — Progress Notes (Signed)
Subjective:   Rebecca Pope is a 72 y.o. female who presents for Medicare Annual (Subsequent) preventive examination.  Visit Complete: Virtual I connected with  Rebecca Pope on 05/07/23 by a audio enabled telemedicine application and verified that I am speaking with the correct person using two identifiers.  Patient Location: Home  Provider Location: Office/Clinic  I discussed the limitations of evaluation and management by telemedicine. The patient expressed understanding and agreed to proceed.  Vital Signs: Because this visit was a virtual/telehealth visit, some criteria may be missing or patient reported. Any vitals not documented were not able to be obtained and vitals that have been documented are patient reported.  Cardiac Risk Factors include: advanced age (>69men, >21 women);dyslipidemia;family history of premature cardiovascular disease;hypertension;sedentary lifestyle;smoking/ tobacco exposure     Objective:    Today's Vitals   05/07/23 1505  Weight: 180 lb (81.6 kg)  Height: 5\' 4"  (1.626 m)  PainSc: 0-No pain   Body mass index is 30.9 kg/m.     05/07/2023    3:07 PM 07/12/2021   10:32 AM 04/05/2017    9:52 AM 06/14/2016    3:15 PM 10/23/2013   12:34 PM  Advanced Directives  Does Patient Have a Medical Advance Directive? No Yes No No Patient does not have advance directive  Does patient want to make changes to medical advance directive?  Yes (MAU/Ambulatory/Procedural Areas - Information given)     Would patient like information on creating a medical advance directive? No - Patient declined   No - Patient declined     Current Medications (verified) Outpatient Encounter Medications as of 05/07/2023  Medication Sig   amLODipine (NORVASC) 10 MG tablet Take 1 tablet (10 mg total) by mouth daily. TAKE 1 TABLET(10 MG) BY MOUTH DAILY   omeprazole (PRILOSEC) 20 MG capsule Take 1 capsule (20 mg total) by mouth daily.   rosuvastatin (CRESTOR) 20 MG tablet TAKE 1  TABLET(20 MG) BY MOUTH DAILY   Vitamin D, Ergocalciferol, (DRISDOL) 1.25 MG (50000 UNIT) CAPS capsule Take 1 capsule (50,000 Units total) by mouth every 7 (seven) days. Please fill as a 90 day supply   No facility-administered encounter medications on file as of 05/07/2023.    Allergies (verified) Patient has no known allergies.   History: Past Medical History:  Diagnosis Date   GERD (gastroesophageal reflux disease)    Hyperlipidemia    Hypertension    Osteopenia determined by x-ray    Past Surgical History:  Procedure Laterality Date   APPENDECTOMY     Family History  Problem Relation Age of Onset   Cancer Mother    Hypertension Mother    Cancer Father    Breast cancer Neg Hx    Social History   Socioeconomic History   Marital status: Single    Spouse name: Not on file   Number of children: Not on file   Years of education: Not on file   Highest education level: Not on file  Occupational History   Not on file  Tobacco Use   Smoking status: Every Day    Current packs/day: 0.25    Average packs/day: 0.3 packs/day for 37.0 years (9.3 ttl pk-yrs)    Types: Cigarettes   Smokeless tobacco: Never  Vaping Use   Vaping status: Never Used  Substance and Sexual Activity   Alcohol use: No   Drug use: No   Sexual activity: Not Currently  Other Topics Concern   Not on file  Social History Narrative  Not on file   Social Determinants of Health   Financial Resource Strain: Low Risk  (05/07/2023)   Overall Financial Resource Strain (CARDIA)    Difficulty of Paying Living Expenses: Not hard at all  Food Insecurity: No Food Insecurity (05/07/2023)   Hunger Vital Sign    Worried About Running Out of Food in the Last Year: Never true    Ran Out of Food in the Last Year: Never true  Transportation Needs: No Transportation Needs (05/07/2023)   PRAPARE - Administrator, Civil Service (Medical): No    Lack of Transportation (Non-Medical): No  Physical  Activity: Insufficiently Active (05/07/2023)   Exercise Vital Sign    Days of Exercise per Week: 2 days    Minutes of Exercise per Session: 20 min  Stress: No Stress Concern Present (05/07/2023)   Rebecca Pope of Occupational Health - Occupational Stress Questionnaire    Feeling of Stress : Not at all  Social Connections: Socially Isolated (05/07/2023)   Social Connection and Isolation Panel [NHANES]    Frequency of Communication with Friends and Family: More than three times a week    Frequency of Social Gatherings with Friends and Family: Three times a week    Attends Religious Services: Never    Active Member of Clubs or Organizations: No    Attends Banker Meetings: Never    Marital Status: Widowed    Tobacco Counseling Ready to quit: Not Answered Counseling given: Not Answered   Clinical Intake:  Pre-visit preparation completed: Yes  Pain : No/denies pain Pain Score: 0-No pain     BMI - recorded: 30.9 Nutritional Status: BMI > 30  Obese Nutritional Risks: None Diabetes: No  How often do you need to have someone help you when you read instructions, pamphlets, or other written materials from your doctor or pharmacy?: 1 - Never What is the last grade level you completed in school?: 11TH GRADE  Interpreter Needed?: No  Information entered by :: Rebecca Alen N. Chikita Dogan, LPN.   Activities of Daily Living    05/07/2023    3:10 PM 08/22/2022    2:18 PM  In your present state of health, do you have any difficulty performing the following activities:  Hearing? 0 0  Vision? 0 0  Difficulty concentrating or making decisions? 0 0  Walking or climbing stairs? 0 0  Dressing or bathing? 0 0  Doing errands, shopping? 0 0  Preparing Food and eating ? N N  Using the Toilet? N N  In the past six months, have you accidently leaked urine? N N  Do you have problems with loss of bowel control? N N  Managing your Medications? N N  Managing your Finances? N N   Housekeeping or managing your Housekeeping? N N    Patient Care Team: Claiborne Rigg, NP as PCP - General (Nurse Practitioner)  Indicate any recent Medical Services you may have received from other than [facility] providers in the past year (date may be approximate).     Assessment:   This is a routine wellness examination for [place].  Hearing/Vision screen Hearing Screening - Comments:: Patient denied any hearing difficulty.   No hearing aids.  Vision Screening - Comments:: Patient does not wear any corrective lenses/contacts.   Eye exam done by:     Goals Addressed             This Visit's Progress    My goal is to stay healthy and quit  smoking.       Quit Smoking Goals:  *Manage cravings and triggers by practicing mindfulness and relaxation techniques. *Create a personalized quit plan. *Enlist support from friends and family. *Reward yourself for accomplishments. *Find alternative activities. *Make healthier choices. *Prepare for difficult situations. *Identify reasons to quit.      Depression Screen    05/07/2023    3:09 PM 08/20/2022    9:17 AM 07/12/2021   10:55 AM 07/12/2021   10:38 AM 02/26/2020    3:59 PM 02/26/2020    3:48 PM 02/20/2019    4:14 PM  PHQ 2/9 Scores  PHQ - 2 Score 0 0 0 0 0 0 0  PHQ- 9 Score 0 0 0  0 0 0    Fall Risk    05/07/2023    3:07 PM 08/20/2022    9:03 AM 05/15/2022   10:09 AM 07/12/2021    1:40 PM 07/12/2021   10:33 AM  Fall Risk   Falls in the past year? 0 0 0 0 0  Number falls in past yr: 0 0 0 0   Injury with Fall? 0 0 0 0 0  Risk for fall due to : No Fall Risks No Fall Risks  No Fall Risks No Fall Risks  Follow up Falls prevention discussed Falls evaluation completed Falls evaluation completed Falls evaluation completed;Education provided;Falls prevention discussed Falls evaluation completed    MEDICARE RISK AT HOME: Medicare Risk at Home Any stairs in or around the home?: Yes If so, are there any without handrails?:  No Home free of loose throw rugs in walkways, pet beds, electrical cords, etc?: Yes Adequate lighting in your home to reduce risk of falls?: Yes Life alert?: No Use of a cane, walker or w/c?: No Grab bars in the bathroom?: No Shower chair or bench in shower?: No Elevated toilet seat or a handicapped toilet?: No  TIMED UP AND GO:  Was the test performed?  No    Cognitive Function:    05/07/2023    3:09 PM 05/15/2022   10:22 AM 07/12/2021   10:39 AM  MMSE - Mini Mental State Exam  Not completed: Unable to complete    Orientation to time  4 4  Orientation to Place  5 5  Registration  3 3  Attention/ Calculation  5 5  Recall  1 2  Language- name 2 objects  2 2  Language- repeat  1 1  Language- follow 3 step command  2 3  Language- read & follow direction  1 1  Write a sentence  1 1  Copy design  0 1  Total score  25 28        05/07/2023    3:08 PM  6CIT Screen  What Year? 0 points  What month? 0 points  What time? 0 points  Count back from 20 0 points  Months in reverse 0 points  Repeat phrase 0 points  Total Score 0 points    Immunizations Immunization History  Administered Date(s) Administered   Fluad Quad(high Dose 65+) 05/15/2022   Influenza,inj,Quad PF,6+ Mos 03/20/2019, 04/07/2021   Moderna Covid-19 Vaccine Bivalent Booster 8yrs & up 07/16/2022   Moderna Sars-Covid-2 Vaccination 09/06/2019, 10/04/2019, 05/31/2020, 05/17/2021   Pneumococcal Conjugate-13 04/29/2015   Pneumococcal Polysaccharide-23 06/14/2016   Tdap 10/23/2013   Zoster Recombinant(Shingrix) 07/12/2021    TDAP status: Up to date  Flu Vaccine status: Due, Education has been provided regarding the importance of this vaccine. Advised may receive this  vaccine at local pharmacy or Health Dept. Aware to provide a copy of the vaccination record if obtained from local pharmacy or Health Dept. Verbalized acceptance and understanding.  Pneumococcal vaccine status: Up to date  Covid-19 vaccine  status: Completed vaccines  Qualifies for Shingles Vaccine? Yes   Zostavax completed No   Shingrix Completed?: No.    Education has been provided regarding the importance of this vaccine. Patient has been advised to call insurance company to determine out of pocket expense if they have not yet received this vaccine. Advised may also receive vaccine at local pharmacy or Health Dept. Verbalized acceptance and understanding.  Screening Tests Health Maintenance  Topic Date Due   Zoster Vaccines- Shingrix (2 of 2) 09/06/2021   INFLUENZA VACCINE  01/24/2023   COVID-19 Vaccine (6 - 2023-24 season) 02/24/2023   MAMMOGRAM  08/31/2023   COLON CANCER SCREENING ANNUAL FOBT  08/31/2023   DTaP/Tdap/Td (2 - Td or Tdap) 10/24/2023   Medicare Annual Wellness (AWV)  05/06/2024   Pneumonia Vaccine 37+ Years old  Completed   DEXA SCAN  Completed   Hepatitis C Screening  Completed   HPV VACCINES  Aged Out   Colonoscopy  Discontinued    Health Maintenance  Health Maintenance Due  Topic Date Due   Zoster Vaccines- Shingrix (2 of 2) 09/06/2021   INFLUENZA VACCINE  01/24/2023   COVID-19 Vaccine (6 - 2023-24 season) 02/24/2023    Colorectal cancer screening: Type of screening: FOBT/FIT. Completed 08/31/2022. Repeat every 1 years  Mammogram status: Completed 08/31/2022. Repeat every year  Bone Density status: Completed 05/29/2018. Results reflect: Bone density results: OSTEOPENIA. Repeat every 2-3 years.  Lung Cancer Screening: (Low Dose CT Chest recommended if Age 69-80 years, 20 pack-year currently smoking OR have quit w/in 15years.) does not qualify.   Lung Cancer Screening Referral: NO  Additional Screening:  Hepatitis C Screening: does qualify; Completed 09/05/2021  Vision Screening: Recommended annual ophthalmology exams for early detection of glaucoma and other disorders of the eye. Is the patient up to date with their annual eye exam?  No  Who is the provider or what is the name of the office  in which the patient attends annual eye exams? Patient could not remember name of eye doctor If pt is not established with a provider, would they like to be referred to a provider to establish care? No .   Dental Screening: Recommended annual dental exams for proper oral hygiene  Diabetic Foot Exam: N/A  Community Resource Referral / Chronic Care Management: CRR required this visit?  No   CCM required this visit?  No     Plan:     I have personally reviewed and noted the following in the patient's chart:   Medical and social history Use of alcohol, tobacco or illicit drugs  Current medications and supplements including opioid prescriptions. Patient is not currently taking opioid prescriptions. Functional ability and status Nutritional status Physical activity Advanced directives List of other physicians Hospitalizations, surgeries, and ER visits in previous 12 months Vitals Screenings to include cognitive, depression, and falls Referrals and appointments  In addition, I have reviewed and discussed with patient certain preventive protocols, quality metrics, and best practice recommendations. A written personalized care plan for preventive services as well as general preventive health recommendations were provided to patient.     Mickeal Needy, LPN   [id number]   After Visit Summary: (Declined) Due to this being a telephonic visit, with patients personalized plan was offered to  patient but patient Declined AVS at this time   Nurse Notes: None

## 2023-05-07 NOTE — Patient Instructions (Signed)
Rebecca Pope , Thank you for taking time to come for your Medicare Wellness Visit. I appreciate your ongoing commitment to your health goals. Please review the following plan we discussed and let me know if I can assist you in the future.   Referrals/Orders/Follow-Ups/Clinician Recommendations: No  This is a list of the screening recommended for you and due dates:  Health Maintenance  Topic Date Due   Zoster (Shingles) Vaccine (2 of 2) 09/06/2021   Flu Shot  01/24/2023   COVID-19 Vaccine (6 - 2023-24 season) 02/24/2023   Mammogram  08/31/2023   Stool Blood Test  08/31/2023   DTaP/Tdap/Td vaccine (2 - Td or Tdap) 10/24/2023   Medicare Annual Wellness Visit  05/06/2024   Pneumonia Vaccine  Completed   DEXA scan (bone density measurement)  Completed   Hepatitis C Screening  Completed   HPV Vaccine  Aged Out   Colon Cancer Screening  Discontinued    Advanced directives: (Declined) Advance directive discussed with you today. Even though you declined this today, please call our office should you change your mind, and we can give you the proper paperwork for you to fill out.  Next Medicare Annual Wellness Visit scheduled for next year: Yes  * Preventive Care attachment * FALL PREVENTION attachment

## 2023-05-13 ENCOUNTER — Other Ambulatory Visit: Payer: Self-pay | Admitting: Family Medicine

## 2023-05-13 DIAGNOSIS — Z8619 Personal history of other infectious and parasitic diseases: Secondary | ICD-10-CM

## 2023-05-31 ENCOUNTER — Other Ambulatory Visit: Payer: Medicare Other

## 2023-06-04 ENCOUNTER — Ambulatory Visit: Payer: Medicare Other | Admitting: Nurse Practitioner

## 2023-09-06 ENCOUNTER — Other Ambulatory Visit: Payer: Self-pay | Admitting: Family Medicine

## 2023-09-06 DIAGNOSIS — Z1231 Encounter for screening mammogram for malignant neoplasm of breast: Secondary | ICD-10-CM

## 2023-09-25 ENCOUNTER — Ambulatory Visit

## 2024-05-12 ENCOUNTER — Ambulatory Visit: Payer: Medicare Other | Attending: Nurse Practitioner
